# Patient Record
Sex: Female | Born: 1970 | Race: Black or African American | Hispanic: No | Marital: Single | Smoking: Current every day smoker
Health system: Southern US, Community
[De-identification: ages and names within clinical notes are randomized; demographics above are authoritative.]

## PROBLEM LIST (undated history)

## (undated) DIAGNOSIS — E119 Type 2 diabetes mellitus without complications: Secondary | ICD-10-CM

---

## 2004-08-22 ENCOUNTER — Emergency Department: Payer: Self-pay | Admitting: Emergency Medicine

## 2004-08-23 ENCOUNTER — Emergency Department: Payer: Self-pay | Admitting: Emergency Medicine

## 2004-12-05 ENCOUNTER — Emergency Department: Payer: Self-pay | Admitting: Emergency Medicine

## 2005-09-14 ENCOUNTER — Other Ambulatory Visit: Payer: Self-pay

## 2005-09-15 ENCOUNTER — Observation Stay: Payer: Self-pay | Admitting: Internal Medicine

## 2005-09-17 ENCOUNTER — Ambulatory Visit: Payer: Self-pay | Admitting: Internal Medicine

## 2005-11-02 ENCOUNTER — Ambulatory Visit: Payer: Self-pay | Admitting: Family Medicine

## 2005-11-15 ENCOUNTER — Other Ambulatory Visit: Payer: Self-pay

## 2005-11-15 ENCOUNTER — Emergency Department: Payer: Self-pay | Admitting: Emergency Medicine

## 2005-12-02 ENCOUNTER — Ambulatory Visit: Payer: Self-pay | Admitting: Family Medicine

## 2006-03-31 ENCOUNTER — Emergency Department: Payer: Self-pay | Admitting: Unknown Physician Specialty

## 2007-01-15 ENCOUNTER — Emergency Department: Payer: Self-pay | Admitting: Emergency Medicine

## 2007-03-30 ENCOUNTER — Emergency Department: Payer: Self-pay | Admitting: Emergency Medicine

## 2007-12-06 ENCOUNTER — Emergency Department: Payer: Self-pay | Admitting: Unknown Physician Specialty

## 2007-12-09 ENCOUNTER — Ambulatory Visit: Payer: Self-pay | Admitting: Family Medicine

## 2007-12-16 ENCOUNTER — Ambulatory Visit: Payer: Self-pay | Admitting: Family Medicine

## 2007-12-22 ENCOUNTER — Ambulatory Visit: Payer: Self-pay | Admitting: Family Medicine

## 2010-01-14 ENCOUNTER — Emergency Department: Payer: Self-pay | Admitting: Unknown Physician Specialty

## 2010-09-03 HISTORY — PX: BACK SURGERY: SHX140

## 2010-10-11 ENCOUNTER — Ambulatory Visit: Payer: Self-pay | Admitting: Family Medicine

## 2010-10-23 ENCOUNTER — Emergency Department: Payer: Self-pay | Admitting: Unknown Physician Specialty

## 2010-10-30 ENCOUNTER — Emergency Department: Payer: Self-pay | Admitting: Internal Medicine

## 2010-11-10 DIAGNOSIS — IMO0002 Reserved for concepts with insufficient information to code with codable children: Secondary | ICD-10-CM | POA: Insufficient documentation

## 2010-12-13 DIAGNOSIS — I824Z9 Acute embolism and thrombosis of unspecified deep veins of unspecified distal lower extremity: Secondary | ICD-10-CM | POA: Insufficient documentation

## 2011-01-26 DIAGNOSIS — M21969 Unspecified acquired deformity of unspecified lower leg: Secondary | ICD-10-CM | POA: Insufficient documentation

## 2011-02-11 ENCOUNTER — Emergency Department: Payer: Self-pay | Admitting: Emergency Medicine

## 2014-07-26 ENCOUNTER — Ambulatory Visit: Payer: Self-pay | Admitting: Physician Assistant

## 2015-08-07 ENCOUNTER — Encounter: Payer: Self-pay | Admitting: Emergency Medicine

## 2015-08-07 ENCOUNTER — Emergency Department: Payer: 59

## 2015-08-07 ENCOUNTER — Emergency Department
Admission: EM | Admit: 2015-08-07 | Discharge: 2015-08-07 | Disposition: A | Discharge status: Home / Self Care | Payer: 59 | Attending: Emergency Medicine | Admitting: Emergency Medicine

## 2015-08-07 DIAGNOSIS — S29012A Strain of muscle and tendon of back wall of thorax, initial encounter: Secondary | ICD-10-CM | POA: Diagnosis not present

## 2015-08-07 DIAGNOSIS — Y9289 Other specified places as the place of occurrence of the external cause: Secondary | ICD-10-CM | POA: Insufficient documentation

## 2015-08-07 DIAGNOSIS — X58XXXA Exposure to other specified factors, initial encounter: Secondary | ICD-10-CM | POA: Diagnosis not present

## 2015-08-07 DIAGNOSIS — Y998 Other external cause status: Secondary | ICD-10-CM | POA: Diagnosis not present

## 2015-08-07 DIAGNOSIS — S299XXA Unspecified injury of thorax, initial encounter: Secondary | ICD-10-CM | POA: Diagnosis present

## 2015-08-07 DIAGNOSIS — F17200 Nicotine dependence, unspecified, uncomplicated: Secondary | ICD-10-CM | POA: Insufficient documentation

## 2015-08-07 DIAGNOSIS — S29019A Strain of muscle and tendon of unspecified wall of thorax, initial encounter: Secondary | ICD-10-CM

## 2015-08-07 DIAGNOSIS — Y9389 Activity, other specified: Secondary | ICD-10-CM | POA: Insufficient documentation

## 2015-08-07 LAB — URINALYSIS COMPLETE WITH MICROSCOPIC (ARMC ONLY)
Glucose, UA: >500 mg/dL — AB
Hgb urine dipstick: NEGATIVE
LEUKOCYTES UA: NEGATIVE
NITRITE: NEGATIVE
PH: 5 (ref 5.0–8.0)
PROTEIN: 30 mg/dL — AB
SPECIFIC GRAVITY, URINE: 1.045 — AB (ref 1.005–1.030)

## 2015-08-07 MED ORDER — METHOCARBAMOL 500 MG PO TABS
1000.0000 mg | ORAL_TABLET | Freq: Once | ORAL | Status: AC
  Administered 2015-08-07: 1000 mg via ORAL

## 2015-08-07 MED ORDER — KETOROLAC TROMETHAMINE 10 MG PO TABS: 10.0000 mg | ORAL_TABLET | Freq: Four times a day (QID) | ORAL | Status: DC | PRN

## 2015-08-07 MED ORDER — METHOCARBAMOL 1000 MG/10ML IJ SOLN
1000.0000 mg | Freq: Once | INTRAMUSCULAR | Status: DC
  Filled 2015-08-07: qty 10

## 2015-08-07 MED ORDER — ORPHENADRINE CITRATE 30 MG/ML IJ SOLN
60.0000 mg | Freq: Two times a day (BID) | INTRAMUSCULAR | Status: DC
  Filled 2015-08-07: qty 2

## 2015-08-07 MED ORDER — KETOROLAC TROMETHAMINE 60 MG/2ML IM SOLN
60.0000 mg | Freq: Once | INTRAMUSCULAR | Status: AC
  Administered 2015-08-07: 60 mg via INTRAMUSCULAR
  Filled 2015-08-07: qty 2

## 2015-08-07 MED ORDER — METHOCARBAMOL 500 MG PO TABS
ORAL_TABLET | ORAL | Status: AC
  Administered 2015-08-07: 1000 mg via ORAL
  Filled 2015-08-07: qty 2

## 2015-08-07 MED ORDER — HYDROMORPHONE HCL 1 MG/ML IJ SOLN
1.0000 mg | Freq: Once | INTRAMUSCULAR | Status: AC
  Administered 2015-08-07: 1 mg via INTRAMUSCULAR
  Filled 2015-08-07: qty 1

## 2015-08-07 MED ORDER — ONDANSETRON 4 MG PO TBDP
ORAL_TABLET | ORAL | Status: AC
  Filled 2015-08-07: qty 1

## 2015-08-07 MED ORDER — TRAMADOL HCL 50 MG PO TABS: 50.0000 mg | ORAL_TABLET | Freq: Four times a day (QID) | ORAL | Status: DC | PRN

## 2015-08-07 MED ORDER — METHOCARBAMOL 750 MG PO TABS: 1500.0000 mg | ORAL_TABLET | Freq: Four times a day (QID) | ORAL | Status: DC

## 2015-08-07 MED ORDER — ONDANSETRON 8 MG PO TBDP
8.0000 mg | ORAL_TABLET | Freq: Once | ORAL | Status: AC
  Administered 2015-08-07: 8 mg via ORAL
  Filled 2015-08-07: qty 1

## 2015-08-07 NOTE — Discharge Instructions (Signed)
Thoracic Strain °Thoracic strain is an injury to the muscles or tendons that attach to the upper back. A strain can be mild or severe. A mild strain may take only 1-2 weeks to heal. A severe strain involves torn muscles or tendons, so it may take 6-8 weeks to heal. °HOME CARE °· Rest as needed. Limit your activity as told by your doctor. °· If directed, put ice on the injured area: °¨ Put ice in a plastic bag. °¨ Place a towel between your skin and the bag. °¨ Leave the ice on for 20 minutes, 2-3 times per day. °· Take over-the-counter and prescription medicines only as told by your doctor. °· Begin doing exercises as told by your doctor or physical therapist. °· Warm up before being active. °· Bend your knees before you lift heavy objects. °· Keep all follow-up visits as told by your doctor. This is important. °GET HELP IF: °· Your pain is not helped by medicine. °· Your pain, bruising, or swelling is getting worse. °· You have a fever. °GET HELP RIGHT AWAY IF: °· You have shortness of breath. °· You have chest pain. °· You have weakness or loss of feeling (numbness) in your legs. °· You cannot control when you pee (urinate). °  °This information is not intended to replace advice given to you by your health care provider. Make sure you discuss any questions you have with your health care provider. °  °Document Released: 02/06/2008 Document Revised: 05/11/2015 Document Reviewed: 10/14/2014 °Elsevier Interactive Patient Education ©2016 Elsevier Inc. ° °

## 2015-08-07 NOTE — ED Notes (Signed)
Pt given zofran - states doesn't want to wait stating since she threw up she wouldn't throw up again. Pt medicated po meds

## 2015-08-07 NOTE — ED Notes (Addendum)
Pt presents to ER with family with c/o back pain that started this morning when she woke up Pt states pain is where her ribs are more so mid back pain. Pt states pain is sharp. Pt has hx of back surgery. Denies all other sources of pain. 10/10. Pt denies hx of kidney stones.

## 2015-08-07 NOTE — ED Provider Notes (Signed)
[facility] Emergency Department Provider Note  ____________________________________________  Time seen: Approximately 8:13 PM  I have reviewed the triage vital signs and the nursing notes.   HISTORY  Chief Complaint Back Pain    HPI Denise Olsen is a 44 y.o. female patient complaining of right upper back pain to the point a.m. awakening. Patient stated the pain is in the wrist and radiates to her mid back. Describes the pain as sharp. Patient denies any provocative incident for this complaint. Patient is rating the pain as 8/10. No palliative measures taken for this complaint. Patient denies any history of kidney stones.   History reviewed. No pertinent past medical history.  There are no active problems to display for this patient.   History reviewed. No pertinent past surgical history.  Current Outpatient Rx  Name  Route  Sig  Dispense  Refill  . ketorolac (TORADOL) 10 MG tablet   Oral   Take 1 tablet (10 mg total) by mouth every 6 (six) hours as needed.   20 tablet   0   . methocarbamol (ROBAXIN-750) 750 MG tablet   Oral   Take 2 tablets (1,500 mg total) by mouth 4 (four) times daily.   40 tablet   0   . traMADol (ULTRAM) 50 MG tablet   Oral   Take 1 tablet (50 mg total) by mouth every 6 (six) hours as needed for moderate pain.   12 tablet   0     Allergies Review of patient's allergies indicates no known allergies.  History reviewed. No pertinent family history.  Social History Social History  Substance Use Topics  . Smoking status: Current Every Day Smoker  . Smokeless tobacco: None  . Alcohol Use: No    Review of Systems Constitutional: No fever/chills Eyes: No visual changes. ENT: No sore throat. Cardiovascular: Denies chest pain. Respiratory: Denies shortness of breath. Gastrointestinal: No abdominal pain.  No nausea, no vomiting.  No diarrhea.  No constipation. Genitourinary: Negative for  dysuria. Musculoskeletal: Right thoracic pain  Skin: Negative for rash. Neurological: Negative for headaches, focal weakness or numbness. 10-point ROS otherwise negative.  ____________________________________________   PHYSICAL EXAM:  VITAL SIGNS: ED Triage Vitals  Enc Vitals Group     BP 08/07/15 1944 167/95 mmHg     Pulse Rate 08/07/15 1944 110     Resp 08/07/15 1944 18     Temp 08/07/15 1944 97.6 F (36.4 C)     Temp Source 08/07/15 1944 Oral     SpO2 08/07/15 1944 97 %     Weight 08/07/15 1944 185 lb (83.915 kg)     Height 08/07/15 1944 5\' 7"  (1.702 m)     Head Cir --      Peak Flow --      Pain Score 08/07/15 1945 10     Pain Loc --      Pain Edu? --      Excl. in GC? --     Constitutional: Alert and oriented. Well appearing and in no acute distress. Eyes: Conjunctivae are normal. PERRL. EOMI. Head: Atraumatic. Nose: No congestion/rhinnorhea. Mouth/Throat: Mucous membranes are moist.  Oropharynx non-erythematous. Neck: No stridor.  No cervical spine tenderness to palpation. Hematological/Lymphatic/Immunilogical: No cervical lymphadenopathy. Cardiovascular: Normal rate, regular rhythm. Grossly normal heart sounds.  Good peripheral circulation. Respiratory: Normal respiratory effort.  No retractions. Lungs CTAB. Gastrointestinal: Soft and nontender. No distention. No abdominal bruits. No CVA tenderness. Musculoskeletal: No lower extremity tenderness nor edema.  No  joint effusions. Neurologic:  Normal speech and language. No gross focal neurologic deficits are appreciated. No gait instability. Skin:  Skin is warm, dry and intact. No rash noted. Psychiatric: Mood and affect are normal. Speech and behavior are normal.  ____________________________________________   LABS (all labs ordered are listed, but only abnormal results are displayed)  Labs Reviewed  URINALYSIS COMPLETEWITH MICROSCOPIC ([facility] ONLY) - Abnormal; Notable for the following:    Color, Urine AMBER  (*)    APPearance CLOUDY (*)    Glucose, UA >500 (*)    Bilirubin Urine 1+ (*)    Ketones, ur TRACE (*)    Specific Gravity, Urine 1.045 (*)    Protein, ur 30 (*)    Bacteria, UA RARE (*)    Squamous Epithelial / LPF 6-30 (*)    All other components within normal limits   ____________________________________________  EKG   ____________________________________________  RADIOLOGY  No acute findings on x-ray. I, Joni Reining, personally viewed and evaluated these images (plain radiographs) as part of my medical decision making.   ____________________________________________   PROCEDURES  Procedure(s) performed: None  Critical Care performed: No  ____________________________________________   INITIAL IMPRESSION / ASSESSMENT AND PLAN / ED COURSE  Pertinent labs & imaging results that were available during my care of the patient were reviewed by me and considered in my medical decision making (see chart for details).  Thoracic strain. Discussed negative x-ray findings a urinalysis with palpation. Patient given discharge home instructions for this complaint. Patient given prescription for tramadol, Robaxin, and Toradol. Patient advised follow-up with her family doctor this condition persists. Patient given a work note for 2 days. ____________________________________________   FINAL CLINICAL IMPRESSION(S) / ED DIAGNOSES  Final diagnoses:  Thoracic myofascial strain, initial encounter      Joni Reining, PA-C 08/07/15 2213  Jeanmarie Plant, MD 08/07/15 2243

## 2015-08-07 NOTE — ED Notes (Addendum)
While trying to do the assessment on the patient and at the same time trying to draw up injections pt became angry and states she wasn't answering anymore questions. I continued drawing up meds and verifying label information and order information, the whole time the pt was yelling and crying. the pt became impatient and rolled over to her side and kept hitting the railing of the bed and screaming. When asked to stop, the husband stated i just needed to give the shots, and she stopped yelling and told me that she wasn't hurting anything and to just hurry up. Continued drawing up meds and gave the two injections. This all occurred within a 4 minute time frame.

## 2017-05-24 ENCOUNTER — Other Ambulatory Visit: Payer: Self-pay | Admitting: Family Medicine

## 2017-05-24 DIAGNOSIS — Z1239 Encounter for other screening for malignant neoplasm of breast: Secondary | ICD-10-CM

## 2017-07-15 DIAGNOSIS — E119 Type 2 diabetes mellitus without complications: Secondary | ICD-10-CM | POA: Insufficient documentation

## 2018-10-09 ENCOUNTER — Encounter: Payer: Self-pay | Admitting: Emergency Medicine

## 2018-10-09 ENCOUNTER — Emergency Department
Admission: EM | Admit: 2018-10-09 | Discharge: 2018-10-09 | Disposition: A | Discharge status: Home / Self Care | Payer: 59 | Attending: Emergency Medicine | Admitting: Emergency Medicine

## 2018-10-09 DIAGNOSIS — Z5321 Procedure and treatment not carried out due to patient leaving prior to being seen by health care provider: Secondary | ICD-10-CM | POA: Insufficient documentation

## 2018-10-09 DIAGNOSIS — R42 Dizziness and giddiness: Secondary | ICD-10-CM | POA: Diagnosis not present

## 2018-10-09 LAB — URINALYSIS, COMPLETE (UACMP) WITH MICROSCOPIC
BILIRUBIN URINE: NEGATIVE
Bacteria, UA: NONE SEEN
GLUCOSE, UA: NEGATIVE mg/dL
KETONES UR: NEGATIVE mg/dL
LEUKOCYTES UA: NEGATIVE
NITRITE: NEGATIVE
PH: 6 (ref 5.0–8.0)
Protein, ur: NEGATIVE mg/dL
SPECIFIC GRAVITY, URINE: 1.016 (ref 1.005–1.030)

## 2018-10-09 LAB — CBC
HCT: 38.2 % (ref 36.0–46.0)
Hemoglobin: 12.1 g/dL (ref 12.0–15.0)
MCH: 26 pg (ref 26.0–34.0)
MCHC: 31.7 g/dL (ref 30.0–36.0)
MCV: 82.2 fL (ref 80.0–100.0)
NRBC: 0 % (ref 0.0–0.2)
PLATELETS: 236 10*3/uL (ref 150–400)
RBC: 4.65 MIL/uL (ref 3.87–5.11)
RDW: 13.4 % (ref 11.5–15.5)
WBC: 12 10*3/uL — AB (ref 4.0–10.5)

## 2018-10-09 LAB — BASIC METABOLIC PANEL
ANION GAP: 9 (ref 5–15)
BUN: 12 mg/dL (ref 6–20)
CALCIUM: 9 mg/dL (ref 8.9–10.3)
CO2: 24 mmol/L (ref 22–32)
Chloride: 105 mmol/L (ref 98–111)
Creatinine, Ser: 0.74 mg/dL (ref 0.44–1.00)
GFR calc non Af Amer: >60 mL/min (ref >60)
Glucose, Bld: 188 mg/dL — ABNORMAL HIGH (ref 70–99)
Potassium: 3.5 mmol/L (ref 3.5–5.1)
SODIUM: 138 mmol/L (ref 135–145)

## 2018-10-09 LAB — GLUCOSE, CAPILLARY: Glucose-Capillary: 155 mg/dL — ABNORMAL HIGH (ref 70–99)

## 2018-10-09 MED ORDER — SODIUM CHLORIDE 0.9% FLUSH: 3.0000 mL | Freq: Once | INTRAVENOUS | Status: DC

## 2018-10-09 NOTE — ED Triage Notes (Signed)
Pt reports she has not been compliant with DM medication and today pt became dizzy at work and CBG was 595. Pt ambulatory to triage with steady gait, no distress noted.

## 2018-10-09 NOTE — ED Notes (Signed)
Pt st doesn't want to wait any longer; pt instr to return for any new or worsening symptoms

## 2018-10-13 ENCOUNTER — Telehealth: Payer: Self-pay | Admitting: Emergency Medicine

## 2018-10-13 NOTE — Telephone Encounter (Signed)
Called patient due to lwot to inquire about condition and follow up plans. She says she has her meds, but had not taken them for a few days.  Says she does not have the correct equipment for testing her sugar, as the pharmacy gave her mismatched supplies/euqipment.  She says she will call her doctor today and have them review the labs done here as well as let them know how she is doing now.

## 2019-07-02 DIAGNOSIS — M79641 Pain in right hand: Secondary | ICD-10-CM | POA: Insufficient documentation

## 2019-11-24 ENCOUNTER — Other Ambulatory Visit: Payer: Self-pay | Admitting: Sports Medicine

## 2019-11-24 DIAGNOSIS — G8929 Other chronic pain: Secondary | ICD-10-CM

## 2019-11-24 DIAGNOSIS — M5030 Other cervical disc degeneration, unspecified cervical region: Secondary | ICD-10-CM

## 2019-11-24 DIAGNOSIS — R200 Anesthesia of skin: Secondary | ICD-10-CM

## 2019-11-24 DIAGNOSIS — M62838 Other muscle spasm: Secondary | ICD-10-CM

## 2019-11-24 DIAGNOSIS — M542 Cervicalgia: Secondary | ICD-10-CM

## 2020-09-13 ENCOUNTER — Other Ambulatory Visit: Payer: Self-pay | Admitting: Physical Medicine & Rehabilitation

## 2020-09-13 DIAGNOSIS — M542 Cervicalgia: Secondary | ICD-10-CM

## 2020-09-13 DIAGNOSIS — G8929 Other chronic pain: Secondary | ICD-10-CM

## 2020-09-13 DIAGNOSIS — M5412 Radiculopathy, cervical region: Secondary | ICD-10-CM

## 2020-09-13 DIAGNOSIS — M5442 Lumbago with sciatica, left side: Secondary | ICD-10-CM

## 2020-09-23 ENCOUNTER — Ambulatory Visit
Admission: RE | Admit: 2020-09-23 | Discharge: 2020-09-23 | Disposition: A | Discharge status: Home / Self Care | Payer: BC Managed Care – PPO | Source: Ambulatory Visit | Attending: Physical Medicine & Rehabilitation | Admitting: Physical Medicine & Rehabilitation

## 2020-09-23 ENCOUNTER — Other Ambulatory Visit: Payer: Self-pay

## 2020-09-23 DIAGNOSIS — M5412 Radiculopathy, cervical region: Secondary | ICD-10-CM | POA: Diagnosis present

## 2020-09-23 DIAGNOSIS — M542 Cervicalgia: Secondary | ICD-10-CM

## 2020-09-23 DIAGNOSIS — M5441 Lumbago with sciatica, right side: Secondary | ICD-10-CM | POA: Diagnosis present

## 2020-09-23 DIAGNOSIS — G8929 Other chronic pain: Secondary | ICD-10-CM

## 2020-09-23 DIAGNOSIS — M5442 Lumbago with sciatica, left side: Secondary | ICD-10-CM | POA: Diagnosis present

## 2020-11-03 ENCOUNTER — Encounter: Payer: Self-pay | Admitting: Obstetrics and Gynecology

## 2020-11-03 ENCOUNTER — Ambulatory Visit (INDEPENDENT_AMBULATORY_CARE_PROVIDER_SITE_OTHER): Payer: BC Managed Care – PPO | Admitting: Obstetrics and Gynecology

## 2020-11-03 ENCOUNTER — Other Ambulatory Visit: Payer: Self-pay

## 2020-11-03 VITALS — BP 120/70 | Ht 67.0 in | Wt 167.4 lb

## 2020-11-03 DIAGNOSIS — N764 Abscess of vulva: Secondary | ICD-10-CM | POA: Diagnosis not present

## 2020-11-03 MED ORDER — FLUCONAZOLE 150 MG PO TABS: 150.0000 mg | ORAL_TABLET | ORAL | 0 refills | Status: AC

## 2020-11-03 MED ORDER — CLINDAMYCIN HCL 300 MG PO CAPS: 300.0000 mg | ORAL_CAPSULE | Freq: Two times a day (BID) | ORAL | 0 refills | Status: AC

## 2020-11-03 NOTE — Progress Notes (Signed)
Patient ID: Denise Olsen, female   DOB: 06-Aug-1971, 50 y.o.   MRN: [id number]  Reason for Consult: Gynecologic Exam   Referred by Raj Janus, MD  Subjective:     HPI:  Denise Olsen is a 50 y.o. female.  She reports that she noticed a boil of her left labia yesterday.  She reports that she is also having groin tenderness.  She reports that sometimes she is more prone to yeast infections after she discontinues her diabetes medication.  She has not previously had a boil or abscess here this would be drained.  History reviewed. No pertinent past medical history. History reviewed. No pertinent family history. History reviewed. No pertinent surgical history.  Short Social History:  Social History   Tobacco Use  . Smoking status: Current Every Day Smoker  . Smokeless tobacco: Never Used  Substance Use Topics  . Alcohol use: No    No Known Allergies  Current Outpatient Medications  Medication Sig Dispense Refill  . atorvastatin (LIPITOR) 40 MG tablet atorvastatin 40 mg tablet    . baclofen (LIORESAL) 10 MG tablet Take 10 mg by mouth 3 (three) times daily.    . Blood Glucose Monitoring Suppl (GLUCOCOM BLOOD GLUCOSE MONITOR) DEVI Accu-Chek Guide Me Glucose Meter  USE AS DIRECTED    . clindamycin (CLEOCIN) 300 MG capsule Take 1 capsule (300 mg total) by mouth 2 (two) times daily for 7 days. 14 capsule 0  . fluconazole (DIFLUCAN) 150 MG tablet Take 1 tablet (150 mg total) by mouth every 3 (three) days for 2 doses. 2 tablet 0  . ketorolac (TORADOL) 10 MG tablet Take 1 tablet (10 mg total) by mouth every 6 (six) hours as needed. 20 tablet 0  . metFORMIN (GLUCOPHAGE) 1000 MG tablet metformin 1,000 mg tablet    . methocarbamol (ROBAXIN-750) 750 MG tablet Take 2 tablets (1,500 mg total) by mouth 4 (four) times daily. 40 tablet 0  . traMADol (ULTRAM) 50 MG tablet Take 1 tablet (50 mg total) by mouth every 6 (six) hours as needed for moderate pain. 12 tablet 0   No current  facility-administered medications for this visit.    Review of Systems  Constitutional: Negative for chills, fatigue, fever and unexpected weight change.  HENT: Negative for trouble swallowing.  Eyes: Negative for loss of vision.  Respiratory: Negative for cough, shortness of breath and wheezing.  Cardiovascular: Negative for chest pain, leg swelling, palpitations and syncope.  GI: Negative for abdominal pain, blood in stool, diarrhea, nausea and vomiting.  GU: Negative for difficulty urinating, dysuria, frequency and hematuria.  Musculoskeletal: Negative for back pain, leg pain and joint pain.  Skin: Negative for rash.  Neurological: Negative for dizziness, headaches, light-headedness, numbness and seizures.  Psychiatric: Positive for depressed mood. Negative for behavioral problem, confusion and sleep disturbance.        Objective:  Objective   Vitals:   11/03/20 1432  BP: 120/70  Weight: 167 lb 6.4 oz (75.9 kg)  Height: 5\' 7"  (1.702 m)   Body mass index is 26.22 kg/m.  Physical Exam Vitals and nursing note reviewed. Exam conducted with a chaperone present.  Constitutional:      Appearance: Normal appearance. She is well-developed.  HENT:     Head: Normocephalic and atraumatic.  Eyes:     Extraocular Movements: Extraocular movements intact.     Pupils: Pupils are equal, round, and reactive to light.  Cardiovascular:     Rate and Rhythm: Normal rate and  regular rhythm.  Pulmonary:     Effort: Pulmonary effort is normal. No respiratory distress.     Breath sounds: Normal breath sounds.  Abdominal:     General: Abdomen is flat.     Palpations: Abdomen is soft.  Genitourinary:    Comments: External: Vulva abnormal. Abscess noted on left vulva 2cm x 1cm.   Musculoskeletal:        General: No signs of injury.  Skin:    General: Skin is warm and dry.  Neurological:     Mental Status: She is alert and oriented to person, place, and time.  Psychiatric:         Behavior: Behavior normal.        Thought Content: Thought content normal.        Judgment: Judgment normal.     Assessment/Plan:     50 year old with left vulvar abscess.  Abscess 2 cm x 1 cm in size of left labia.  Will trial oral antibiotics and advised sitz bath's over the weekend.  Patient will follow up early next week.  If abscess needs to be drained drainage can be performed at that time.  We will treat with Diflucan given patient's propensity for yeast infections after antibiotics and because of her history of diabetes.  Discussed Pap smear patient.  Patient not here today for annual but Pap smear is due.  Patient will have Pap smear when she follows up on Monday.  More than 20 minutes were spent face to face with the patient in the room, reviewing the medical record, labs and images, and coordinating care for the patient. The plan of management was discussed in detail and counseling was provided.      Adelene Idler MD [facility], [facility] 11/03/2020 2:56 PM

## 2020-11-03 NOTE — Patient Instructions (Addendum)
Skin Abscess  A skin abscess is an infected area on or under your skin that contains a collection of pus and other material. An abscess may also be called a furuncle, carbuncle, or boil. An abscess can occur in or on almost any part of your body. Some abscesses break open (rupture) on their own. Most continue to get worse unless they are treated. The infection can spread deeper into the body and eventually into your blood, which can make you feel ill. Treatment usually involves draining the abscess. What are the causes? An abscess occurs when germs, like bacteria, pass through your skin and cause an infection. This may be caused by:  A scrape or cut on your skin.  A puncture wound through your skin, including a needle injection or insect bite.  Blocked oil or sweat glands.  Blocked and infected hair follicles.  A cyst that forms beneath your skin (sebaceous cyst) and becomes infected. What increases the risk? This condition is more likely to develop in people who:  Have a weak body defense system (immune system).  Have diabetes.  Have dry and irritated skin.  Get frequent injections or use illegal IV drugs.  Have a foreign body in a wound, such as a splinter.  Have problems with their lymph system or veins. What are the signs or symptoms? Symptoms of this condition include:  A painful, firm bump under the skin.  A bump with pus at the top. This may break through the skin and drain. Other symptoms include:  Redness surrounding the abscess site.  Warmth.  Swelling of the lymph nodes (glands) near the abscess.  Tenderness.  A sore on the skin. How is this diagnosed? This condition may be diagnosed based on:  A physical exam.  Your medical history.  A sample of pus. This may be used to find out what is causing the infection.  Blood tests.  Imaging tests, such as an ultrasound, CT scan, or MRI. How is this treated? A small abscess that drains on its own may not  need treatment. Treatment for larger abscesses may include:  Moist heat or heat pack applied to the area several times a day.  A procedure to drain the abscess (incision and drainage).  Antibiotic medicines. For a severe abscess, you may first get antibiotics through an IV and then change to antibiotics by mouth. Follow these instructions at home: Medicines  Take over-the-counter and prescription medicines only as told by your health care provider.  If you were prescribed an antibiotic medicine, take it as told by your health care provider. Do not stop taking the antibiotic even if you start to feel better.   Abscess care  If you have an abscess that has not drained, apply heat to the affected area. Use the heat source that your health care provider recommends, such as a moist heat pack or a heating pad. ? Place a towel between your skin and the heat source. ? Leave the heat on for 20-30 minutes. ? Remove the heat if your skin turns bright red. This is especially important if you are unable to feel pain, heat, or cold. You may have a greater risk of getting burned.  Follow instructions from your health care provider about how to take care of your abscess. Make sure you: ? Cover the abscess with a bandage (dressing). ? Change your dressing or gauze as told by your health care provider. ? Wash your hands with soap and water before you change the   dressing or gauze. If soap and water are not available, use hand sanitizer.  Check your abscess every day for signs of a worsening infection. Check for: ? More redness, swelling, or pain. ? More fluid or blood. ? Warmth. ? More pus or a bad smell.   General instructions  To avoid spreading the infection: ? Do not share personal care items, towels, or hot tubs with others. ? Avoid making skin contact with other people.  Keep all follow-up visits as told by your health care provider. This is important. Contact a health care provider if you  have:  More redness, swelling, or pain around your abscess.  More fluid or blood coming from your abscess.  Warm skin around your abscess.  More pus or a bad smell coming from your abscess.  A fever.  Muscle aches.  Chills or a general ill feeling. Get help right away if you:  Have severe pain.  See red streaks on your skin spreading away from the abscess. Summary  A skin abscess is an infected area on or under your skin that contains a collection of pus and other material.  A small abscess that drains on its own may not need treatment.  Treatment for larger abscesses may include having a procedure to drain the abscess and taking an antibiotic. This information is not intended to replace advice given to you by your health care provider. Make sure you discuss any questions you have with your health care provider. Document Revised: 12/11/2018 Document Reviewed: 10/03/2017 Elsevier Patient Education  2021 Elsevier Inc. How to Take a [employer] A sitz bath is a warm water bath that may be used to care for your rectum, genital area, or the area between your rectum and genitals (perineum). In a sitz bath, the water only comes up to your hips and covers your buttocks. A sitz bath may be done in a bathtub or with a portable sitz bath that fits over the toilet. Your health care provider may recommend a sitz bath to help:  Relieve pain and discomfort after delivering a baby.  Relieve pain and itching from hemorrhoids or anal fissures.  Relieve pain after certain surgeries.  Relax muscles that are sore or tight. How to take a sitz bath Take 3-4 sitz baths a day, or as many as told by your health care provider. Bathtub sitz bath To take a sitz bath in a bathtub: 1. Partially fill a bathtub with warm water. The water should be deep enough to cover your hips and buttocks when you are sitting in the tub. 2. Follow your health care provider's instructions if you are told to put medicine  in the water. 3. Sit in the water. Open the tub drain a little, and leave it open during your bath. 4. Turn on the warm water again, enough to replace the water that is draining out. Keep the water running throughout your bath. This helps keep the water at the right level and temperature. 5. Soak in the water for 15-20 minutes, or as long as told by your health care provider. 6. When you are done, be careful when you stand up. You may feel dizzy. 7. After the sitz bath, pat yourself dry. Do not rub your skin to dry it.   Over-the-toilet sitz bath To take a sitz bath with an over-the-toilet basin: 1. Follow the manufacturer's instructions. 2. Fill the basin with warm water. 3. Follow your health care provider's instructions if you were told to put  medicine in the water. 4. Sit on the seat. Make sure the water covers your buttocks and perineum. 5. Soak in the water for 15-20 minutes, or as long as told by your health care provider. 6. After the sitz bath, pat yourself dry. Do not rub your skin to dry it. 7. Clean and dry the basin between uses. 8. Discard the basin if it cracks, or according to the manufacturer's instructions.   Contact a health care provider if:  Your pain or itching gets worse. Do not continue with sitz baths if your symptoms get worse.  You have new symptoms. Do not continue with sitz baths until you talk with your health care provider. Summary  A sitz bath is a warm water bath in which the water only comes up to your hips and covers your buttocks.  A sitz bath may help relieve pain and discomfort after delivering a baby. It also may help with pain and itching from hemorrhoids or anal fissures, or pain after certain surgeries. It can also help to relax muscles that are sore or tight.  Take 3-4 sitz baths a day, or as many as told by your health care provider. Soak in the water for 15-20 minutes.  Do not continue with sitz baths if your symptoms get worse. This  information is not intended to replace advice given to you by your health care provider. Make sure you discuss any questions you have with your health care provider. Document Revised: 05/05/2020 Document Reviewed: 05/05/2020 Elsevier Patient Education  2021 [employer].

## 2021-05-31 ENCOUNTER — Encounter: Payer: Self-pay | Admitting: Emergency Medicine

## 2021-05-31 ENCOUNTER — Other Ambulatory Visit: Payer: Self-pay

## 2021-05-31 ENCOUNTER — Ambulatory Visit
Admission: EM | Admit: 2021-05-31 | Discharge: 2021-05-31 | Disposition: A | Discharge status: Home / Self Care | Payer: BC Managed Care – PPO | Attending: Internal Medicine | Admitting: Internal Medicine

## 2021-05-31 DIAGNOSIS — M542 Cervicalgia: Secondary | ICD-10-CM | POA: Insufficient documentation

## 2021-05-31 DIAGNOSIS — M549 Dorsalgia, unspecified: Secondary | ICD-10-CM | POA: Diagnosis present

## 2021-05-31 DIAGNOSIS — F17210 Nicotine dependence, cigarettes, uncomplicated: Secondary | ICD-10-CM | POA: Insufficient documentation

## 2021-05-31 DIAGNOSIS — F32A Depression, unspecified: Secondary | ICD-10-CM | POA: Insufficient documentation

## 2021-05-31 DIAGNOSIS — M546 Pain in thoracic spine: Secondary | ICD-10-CM | POA: Insufficient documentation

## 2021-05-31 DIAGNOSIS — R634 Abnormal weight loss: Secondary | ICD-10-CM | POA: Insufficient documentation

## 2021-05-31 DIAGNOSIS — M25519 Pain in unspecified shoulder: Secondary | ICD-10-CM | POA: Insufficient documentation

## 2021-05-31 DIAGNOSIS — E119 Type 2 diabetes mellitus without complications: Secondary | ICD-10-CM | POA: Insufficient documentation

## 2021-05-31 LAB — COMPREHENSIVE METABOLIC PANEL
ALT: 19 U/L (ref 0–44)
AST: 25 U/L (ref 15–41)
Albumin: 4.6 g/dL (ref 3.5–5.0)
Alkaline Phosphatase: 101 U/L (ref 38–126)
Anion gap: 9 (ref 5–15)
BUN: 9 mg/dL (ref 6–20)
CO2: 23 mmol/L (ref 22–32)
Calcium: 9.4 mg/dL (ref 8.9–10.3)
Chloride: 103 mmol/L (ref 98–111)
Creatinine, Ser: 0.63 mg/dL (ref 0.44–1.00)
GFR, Estimated: >60 mL/min (ref >60)
Glucose, Bld: 111 mg/dL — ABNORMAL HIGH (ref 70–99)
Potassium: 3.9 mmol/L (ref 3.5–5.1)
Sodium: 135 mmol/L (ref 135–145)
Total Bilirubin: 0.6 mg/dL (ref 0.3–1.2)
Total Protein: 8.4 g/dL — ABNORMAL HIGH (ref 6.5–8.1)

## 2021-05-31 LAB — CBC WITH DIFFERENTIAL/PLATELET
Abs Immature Granulocytes: 0.04 10*3/uL (ref 0.00–0.07)
Basophils Absolute: 0.1 10*3/uL (ref 0.0–0.1)
Basophils Relative: 1 %
Eosinophils Absolute: 0.8 10*3/uL — ABNORMAL HIGH (ref 0.0–0.5)
Eosinophils Relative: 6 %
HCT: 39.8 % (ref 36.0–46.0)
Hemoglobin: 12.9 g/dL (ref 12.0–15.0)
Immature Granulocytes: 0 %
Lymphocytes Relative: 40 %
Lymphs Abs: 4.9 10*3/uL — ABNORMAL HIGH (ref 0.7–4.0)
MCH: 26.5 pg (ref 26.0–34.0)
MCHC: 32.4 g/dL (ref 30.0–36.0)
MCV: 81.9 fL (ref 80.0–100.0)
Monocytes Absolute: 0.9 10*3/uL (ref 0.1–1.0)
Monocytes Relative: 7 %
Neutro Abs: 5.5 10*3/uL (ref 1.7–7.7)
Neutrophils Relative %: 46 %
Platelets: 329 10*3/uL (ref 150–400)
RBC: 4.86 MIL/uL (ref 3.87–5.11)
RDW: 13.6 % (ref 11.5–15.5)
WBC: 12.2 10*3/uL — ABNORMAL HIGH (ref 4.0–10.5)
nRBC: 0 % (ref 0.0–0.2)

## 2021-05-31 LAB — VITAMIN D 25 HYDROXY (VIT D DEFICIENCY, FRACTURES): Vit D, 25-Hydroxy: 19.63 ng/mL — ABNORMAL LOW (ref 30–100)

## 2021-05-31 LAB — GLUCOSE, CAPILLARY: Glucose-Capillary: 119 mg/dL — ABNORMAL HIGH (ref 70–99)

## 2021-05-31 LAB — HEMOGLOBIN A1C
Hgb A1c MFr Bld: 8.2 % — ABNORMAL HIGH (ref 4.8–5.6)
Mean Plasma Glucose: 188.64 mg/dL

## 2021-05-31 LAB — TSH: TSH: 1.682 u[IU]/mL (ref 0.350–4.500)

## 2021-05-31 MED ORDER — CYCLOBENZAPRINE HCL 7.5 MG PO TABS: 7.5000 mg | ORAL_TABLET | Freq: Every evening | ORAL | 0 refills | Status: DC | PRN

## 2021-05-31 MED ORDER — KETOROLAC TROMETHAMINE 60 MG/2ML IM SOLN
30.0000 mg | Freq: Once | INTRAMUSCULAR | Status: AC
  Administered 2021-05-31: 30 mg via INTRAMUSCULAR

## 2021-05-31 MED ORDER — IBUPROFEN 600 MG PO TABS: 600.0000 mg | ORAL_TABLET | Freq: Four times a day (QID) | ORAL | 0 refills | Status: DC | PRN

## 2021-05-31 NOTE — ED Triage Notes (Signed)
Pt presents today with c/o of upper back/neck pain x 2 days. She reports doing heavy lifting at work which may be contributing to her pain. No specific injury.

## 2021-05-31 NOTE — Discharge Instructions (Addendum)
Please take medications as prescribed Gentle range of motion exercises Heating pad use on a 30-minute on-30 minutes off cycle will help with the back pain Take muscle relaxant only at bedtime.  Do not operate any machinery or drive after taking muscle relaxant because it makes you drowsy Follow-up with primary care physician to be restarted on your diabetes medications Return to urgent care if symptoms worsen.

## 2021-05-31 NOTE — ED Provider Notes (Addendum)
[facility]    CSN: [id number] Arrival date & time: 05/31/21  1527      History   Chief Complaint Chief Complaint  Patient presents with   Neck Pain   Shoulder Pain   Back Pain    HPI Denise Olsen is a 50 y.o. female with history of diabetes mellitus type 2 comes to urgent care with 2-day history of mid back pain as well as neck pain.  Pain is severe, sharp, aggravated by movement with no known relieving factors.  Patient denies any fall or trauma to the back.  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  No numbness or tingling in the fingers.  No weakness in the hands.  No bruising over the back.  Patient also complains of weight loss.  She has lost appetite over the past several months.  She feels stressed.  She has depressed mood over the past several months.  She denies any suicidal or homicidal thoughts. HPI  History reviewed. No pertinent past medical history.  There are no problems to display for this patient.   Past Surgical History:  Procedure Laterality Date   BACK SURGERY  2012    OB History   No obstetric history on file.      Home Medications    Prior to Admission medications   Medication Sig Start Date End Date Taking? Authorizing Provider  cyclobenzaprine (FEXMID) 7.5 MG tablet Take 1 tablet (7.5 mg total) by mouth at bedtime as needed for muscle spasms. 05/31/21  Yes Vonda Harth, Britta Mccreedy, MD  ibuprofen (ADVIL) 600 MG tablet Take 1 tablet (600 mg total) by mouth every 6 (six) hours as needed. 05/31/21  Yes Zaiyah Sottile, Britta Mccreedy, MD  Blood Glucose Monitoring Suppl (GLUCOCOM BLOOD GLUCOSE MONITOR) DEVI Accu-Chek Guide Me Glucose Meter  USE AS DIRECTED    [provider]    Family History History reviewed. No pertinent family history.  Social History Social History   Tobacco Use   Smoking status: Every Day   Smokeless tobacco: Never  Vaping Use   Vaping Use: Never used  Substance Use Topics    Alcohol use: No   Drug use: No     Allergies   Patient has no known allergies.   Review of Systems Review of Systems  Constitutional: Negative.   Respiratory: Negative.  Negative for cough, chest tightness and shortness of breath.   Gastrointestinal: Negative.   Musculoskeletal:  Negative for arthralgias and joint swelling.  Skin: Negative.   Neurological: Negative.   Psychiatric/Behavioral: Negative.      Physical Exam Triage Vital Signs ED Triage Vitals  Enc Vitals Group     BP 05/31/21 1551 115/67     Pulse Rate 05/31/21 1551 86     Resp 05/31/21 1551 18     Temp 05/31/21 1551 98.3 F (36.8 C)     Temp Source 05/31/21 1551 Oral     SpO2 05/31/21 1551 100 %     Weight --      Height --      Head Circumference --      Peak Flow --      Pain Score 05/31/21 1547 7     Pain Loc --      Pain Edu? --      Excl. in GC? --    No data found.  Updated Vital Signs BP 115/67 (BP Location: Left Arm)   Pulse 86   Temp 98.3 F (36.8 C) (  Oral)   Resp 18   LMP  (Exact Date)   SpO2 100%   Visual Acuity Right Eye Distance:   Left Eye Distance:   Bilateral Distance:    Right Eye Near:   Left Eye Near:    Bilateral Near:     Physical Exam Vitals and nursing note reviewed.  Constitutional:      General: She is not in acute distress.    Appearance: She is not ill-appearing.  Cardiovascular:     Rate and Rhythm: Normal rate and regular rhythm.     Pulses: Normal pulses.     Heart sounds: Normal heart sounds.  Pulmonary:     Effort: Pulmonary effort is normal.     Breath sounds: Normal breath sounds.  Musculoskeletal:        General: Tenderness present. No swelling. Normal range of motion.     Comments: Tenderness over the right paraspinal muscles in the upper back.  Neurological:     Mental Status: She is alert.     UC Treatments / Results  Labs (all labs ordered are listed, but only abnormal results are displayed) Labs Reviewed  CBC WITH  DIFFERENTIAL/PLATELET - Abnormal; Notable for the following components:      Result Value   WBC 12.2 (*)    Lymphs Abs 4.9 (*)    Eosinophils Absolute 0.8 (*)    All other components within normal limits  COMPREHENSIVE METABOLIC PANEL - Abnormal; Notable for the following components:   Glucose, Bld 111 (*)    Total Protein 8.4 (*)    All other components within normal limits  GLUCOSE, CAPILLARY - Abnormal; Notable for the following components:   Glucose-Capillary 119 (*)    All other components within normal limits  VITAMIN D 25 HYDROXY (VIT D DEFICIENCY, FRACTURES)  TSH  HEMOGLOBIN A1C  CBG MONITORING, ED    EKG   Radiology No results found.  Procedures Procedures (including critical care time)  Medications Ordered in UC Medications  ketorolac (TORADOL) injection 30 mg (30 mg Intramuscular Given 05/31/21 1640)    Initial Impression / Assessment and Plan / UC Course  I have reviewed the triage vital signs and the nursing notes.  Pertinent labs & imaging results that were available during my care of the patient were reviewed by me and considered in my medical decision making (see chart for details).     1.  Musculoskeletal back pain: Toradol injection 30 mg IM x1 dose Ibuprofen 600 mg every 6 hours as needed for pain Flexeril 7.5 mg at bedtime as needed-precautions given Gentle range of motion exercises Return to urgent care if symptoms worsen  2. Unintentional Weight loss: CBC, BMP, hemoglobin A1c,TSH and point-of-care glucose.  Final Clinical Impressions(s) / UC Diagnoses   Final diagnoses:  Mid back pain on right side     Discharge Instructions      Please take medications as prescribed Gentle range of motion exercises Heating pad use on a 30-minute on-30 minutes off cycle will help with the back pain Take muscle relaxant only at bedtime.  Do not operate any machinery or drive after taking muscle relaxant because it makes you drowsy Follow-up with  primary care physician to be restarted on your diabetes medications Return to urgent care if symptoms worsen.   ED Prescriptions     Medication Sig Dispense Auth. Provider   cyclobenzaprine (FEXMID) 7.5 MG tablet Take 1 tablet (7.5 mg total) by mouth at bedtime as needed for muscle spasms. 20  tablet Nyema Hachey, Britta Mccreedy, MD   ibuprofen (ADVIL) 600 MG tablet Take 1 tablet (600 mg total) by mouth every 6 (six) hours as needed. 30 tablet Mckenize Mezera, Britta Mccreedy, MD      PDMP not reviewed this encounter.   Merrilee Jansky, MD 05/31/21 1705    Merrilee Jansky, MD 07/08/21 2211

## 2021-07-24 ENCOUNTER — Other Ambulatory Visit
Admission: RE | Admit: 2021-07-24 | Discharge: 2021-07-24 | Disposition: A | Discharge status: Home / Self Care | Payer: BC Managed Care – PPO | Source: Ambulatory Visit | Attending: Student | Admitting: Student

## 2021-07-24 DIAGNOSIS — M542 Cervicalgia: Secondary | ICD-10-CM | POA: Diagnosis not present

## 2021-07-24 DIAGNOSIS — R0781 Pleurodynia: Secondary | ICD-10-CM | POA: Insufficient documentation

## 2021-07-24 LAB — D-DIMER, QUANTITATIVE: D-Dimer, Quant: 0.5 ug/mL-FEU (ref 0.00–0.50)

## 2021-07-26 DIAGNOSIS — Z720 Tobacco use: Secondary | ICD-10-CM | POA: Insufficient documentation

## 2021-07-26 DIAGNOSIS — Z3042 Encounter for surveillance of injectable contraceptive: Secondary | ICD-10-CM | POA: Insufficient documentation

## 2021-08-22 ENCOUNTER — Encounter: Payer: Self-pay | Admitting: Emergency Medicine

## 2021-08-22 ENCOUNTER — Ambulatory Visit
Admission: EM | Admit: 2021-08-22 | Discharge: 2021-08-22 | Disposition: A | Discharge status: Home / Self Care | Payer: BC Managed Care – PPO | Attending: Family Medicine | Admitting: Family Medicine

## 2021-08-22 ENCOUNTER — Other Ambulatory Visit: Payer: Self-pay

## 2021-08-22 DIAGNOSIS — J101 Influenza due to other identified influenza virus with other respiratory manifestations: Secondary | ICD-10-CM | POA: Diagnosis not present

## 2021-08-22 LAB — POCT INFLUENZA A/B
Influenza A, POC: POSITIVE — AB
Influenza B, POC: NEGATIVE

## 2021-08-22 MED ORDER — BENZONATATE 100 MG PO CAPS: 200.0000 mg | ORAL_CAPSULE | Freq: Three times a day (TID) | ORAL | 0 refills | Status: DC | PRN

## 2021-08-22 MED ORDER — ALBUTEROL SULFATE HFA 108 (90 BASE) MCG/ACT IN AERS: 1.0000 | INHALATION_SPRAY | Freq: Four times a day (QID) | RESPIRATORY_TRACT | 0 refills | Status: AC | PRN

## 2021-08-22 MED ORDER — OSELTAMIVIR PHOSPHATE 75 MG PO CAPS: 75.0000 mg | ORAL_CAPSULE | Freq: Two times a day (BID) | ORAL | 0 refills | Status: DC

## 2021-08-22 NOTE — Discharge Instructions (Addendum)
Influenza test is positive Continue to alternate Tylenol and ibuprofen for management of fever. Force fluids to maintain hydration. Tamiflu once daily for the next 5 days to reduce symptoms and course of influenza virus.  If you develop any shortness of breath, wheezing or difficulty breathing go immediately to the nearest emergency department.

## 2021-08-22 NOTE — ED Provider Notes (Signed)
Denise Olsen    CSN: [id number] Arrival date & time: 08/22/21  1143      History   Chief Complaint Chief Complaint  Patient presents with   Fever   Generalized Body Aches   Cough    HPI Denise Olsen is a 50 y.o. female.   HPI Patient presents today with a 3-day history of fever, generalized body aches, cough and headache.  Unknown of any sick contacts.  Patient is febrile on arrival 100.3.  She endorses chest heaviness and tightness.  She is a daily smoker.  Reports a history of bronchitis requiring albuterol.  Denies any audible wheezing.  She has not taken any medication for symptoms History reviewed. No pertinent past medical history.  There are no problems to display for this patient.   Past Surgical History:  Procedure Laterality Date   BACK SURGERY  2012    OB History   No obstetric history on file.      Home Medications    Prior to Admission medications   Medication Sig Start Date End Date Taking? Authorizing Provider  albuterol (VENTOLIN HFA) 108 (90 Base) MCG/ACT inhaler Inhale 1-2 puffs into the lungs every 6 (six) hours as needed for wheezing or shortness of breath. 08/22/21  Yes Bing Neighbors, FNP  benzonatate (TESSALON) 100 MG capsule Take 2 capsules (200 mg total) by mouth 3 (three) times daily as needed for cough. 08/22/21  Yes Bing Neighbors, FNP  oseltamivir (TAMIFLU) 75 MG capsule Take 1 capsule (75 mg total) by mouth 2 (two) times daily. 08/22/21  Yes Bing Neighbors, FNP  Blood Glucose Monitoring Suppl (GLUCOCOM BLOOD GLUCOSE MONITOR) DEVI Accu-Chek Guide Me Glucose Meter  USE AS DIRECTED    [provider]  cyclobenzaprine (FEXMID) 7.5 MG tablet Take 1 tablet (7.5 mg total) by mouth at bedtime as needed for muscle spasms. 05/31/21   Merrilee Jansky, MD  ibuprofen (ADVIL) 600 MG tablet Take 1 tablet (600 mg total) by mouth every 6 (six) hours as needed. 05/31/21   Lamptey, Britta Mccreedy, MD    Family  History History reviewed. No pertinent family history.  Social History Social History   Tobacco Use   Smoking status: Every Day   Smokeless tobacco: Never  Vaping Use   Vaping Use: Never used  Substance Use Topics   Alcohol use: No   Drug use: No     Allergies   Patient has no known allergies.  Review of Systems Review of Systems Pertinent negatives listed in HPI   Physical Exam Triage Vital Signs ED Triage Vitals [08/22/21 1322]  Enc Vitals Group     BP 139/82     Pulse Rate (!) 107     Resp 18     Temp 100.3 F (37.9 C)     Temp Source Oral     SpO2 95 %     Weight      Height      Head Circumference      Peak Flow      Pain Score 0     Pain Loc      Pain Edu?      Excl. in GC?    No data found.  Updated Vital Signs BP 139/82 (BP Location: Left Arm)    Pulse (!) 107    Temp 100.3 F (37.9 C) (Oral)    Resp 18    SpO2 95%   Visual Acuity Right Eye Distance:   Left  Eye Distance:   Bilateral Distance:    Right Eye Near:   Left Eye Near:    Bilateral Near:     Physical Exam  General Appearance:    Alert, cooperative, no distress  HENT:   Normocephalic, ears normal, nares mucosal edema with congestion, rhinorrhea, oropharynx clear   Eyes:    PERRL, conjunctiva/corneas clear, EOM's intact       Lungs:     Clear to auscultation bilaterally, respirations unlabored  Heart:    Regular rate and rhythm  Neurologic:   Awake, alert, oriented x 3. No apparent focal neurological           defect.      UC Treatments / Results  Labs (all labs ordered are listed, but only abnormal results are displayed) Labs Reviewed  POCT INFLUENZA A/B - Abnormal; Notable for the following components:      Result Value   Influenza A, POC Positive (*)    All other components within normal limits    EKG   Radiology No results found.  Procedures Procedures (including critical care time)  Medications Ordered in UC Medications - No data to display  Initial  Impression / Assessment and Plan / UC Course  I have reviewed the triage vital signs and the nursing notes.  Pertinent labs & imaging results that were available during my care of the patient were reviewed by me and considered in my medical decision making (see chart for details).    Influenza A confirmed by rapid flu test. Continue to alternate Tylenol and ibuprofen for management of fever. Force fluids to maintain hydration. Tamiflu once daily for the next 5 days to reduce symptoms and course of influenza virus.  If you develop any shortness of breath, wheezing or difficulty breathing go immediately to the nearest emergency department.  Final Clinical Impressions(s) / UC Diagnoses   Final diagnoses:  Influenza A     Discharge Instructions      Influenza test is positive Continue to alternate Tylenol and ibuprofen for management of fever. Force fluids to maintain hydration. Tamiflu once daily for the next 5 days to reduce symptoms and course of influenza virus.  If you develop any shortness of breath, wheezing or difficulty breathing go immediately to the nearest emergency department.      ED Prescriptions     Medication Sig Dispense Auth. Provider   benzonatate (TESSALON) 100 MG capsule Take 2 capsules (200 mg total) by mouth 3 (three) times daily as needed for cough. 40 capsule Bing Neighbors, FNP   oseltamivir (TAMIFLU) 75 MG capsule Take 1 capsule (75 mg total) by mouth 2 (two) times daily. 10 capsule Bing Neighbors, FNP   albuterol (VENTOLIN HFA) 108 (90 Base) MCG/ACT inhaler Inhale 1-2 puffs into the lungs every 6 (six) hours as needed for wheezing or shortness of breath. 8 g Bing Neighbors, FNP      PDMP not reviewed this encounter.   Bing Neighbors, FNP 08/22/21 1407

## 2021-08-22 NOTE — ED Triage Notes (Signed)
Pt c/o cough, fever, bodyaches, HA x 3 days

## 2021-09-14 ENCOUNTER — Other Ambulatory Visit: Payer: Self-pay | Admitting: Family Medicine

## 2021-09-26 DIAGNOSIS — F32A Depression, unspecified: Secondary | ICD-10-CM | POA: Insufficient documentation

## 2021-09-26 DIAGNOSIS — E785 Hyperlipidemia, unspecified: Secondary | ICD-10-CM | POA: Insufficient documentation

## 2022-01-21 ENCOUNTER — Ambulatory Visit: Payer: BC Managed Care – PPO

## 2022-01-21 ENCOUNTER — Ambulatory Visit (INDEPENDENT_AMBULATORY_CARE_PROVIDER_SITE_OTHER): Payer: BC Managed Care – PPO

## 2022-01-21 ENCOUNTER — Ambulatory Visit
Admission: EM | Admit: 2022-01-21 | Discharge: 2022-01-21 | Disposition: A | Discharge status: Home / Self Care | Payer: BC Managed Care – PPO | Attending: Emergency Medicine | Admitting: Emergency Medicine

## 2022-01-21 DIAGNOSIS — M79674 Pain in right toe(s): Secondary | ICD-10-CM | POA: Diagnosis not present

## 2022-01-21 DIAGNOSIS — M7989 Other specified soft tissue disorders: Secondary | ICD-10-CM

## 2022-01-21 MED ORDER — IBUPROFEN 600 MG PO TABS: 600.0000 mg | ORAL_TABLET | Freq: Four times a day (QID) | ORAL | 0 refills | Status: DC | PRN

## 2022-01-21 NOTE — ED Provider Notes (Signed)
Denise Olsen    CSN: [id number] Arrival date & time: 01/21/22  1141      History   Chief Complaint Chief Complaint  Patient presents with   Foot Pain    HPI Denise Olsen is a 51 y.o. female.  Patient presents with pain and swelling of her right foot since yesterday.  No falls or injury.  She attributes her symptoms to wearing wedge heels.  She states she has had intermittent foot pain for 30 years since she broke her foot; she states she gets these symptoms when she wears heels or standing for a long time.  Treatment at home with Tylenol.  She denies numbness, weakness, redness, bruising, lesions, rash, or other symptoms.  The history is provided by the patient and medical records.   History reviewed. No pertinent past medical history.  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      Home Medications    Prior to Admission medications   Medication Sig Start Date End Date Taking? Authorizing Provider  ibuprofen (ADVIL) 600 MG tablet Take 1 tablet (600 mg total) by mouth every 6 (six) hours as needed. 01/21/22  Yes Sharion Balloon, NP  albuterol (VENTOLIN HFA) 108 (90 Base) MCG/ACT inhaler Inhale 1-2 puffs into the lungs every 6 (six) hours as needed for wheezing or shortness of breath. 08/22/21   Scot Jun, FNP  benzonatate (TESSALON) 100 MG capsule Take 2 capsules (200 mg total) by mouth 3 (three) times daily as needed for cough. 08/22/21   Scot Jun, FNP   Blood Glucose Monitoring Suppl (GLUCOCOM BLOOD GLUCOSE MONITOR) DEVI Accu-Chek Guide Me Glucose Meter  USE AS DIRECTED    [provider]  cyclobenzaprine (FEXMID) 7.5 MG tablet Take 1 tablet (7.5 mg total) by mouth at bedtime as needed for muscle spasms. 05/31/21   LampteyMyrene Galas, MD  oseltamivir (TAMIFLU) 75 MG capsule Take 1 capsule (75 mg total) by mouth 2 (two) times daily. 08/22/21   Scot Jun, FNP    Family History History reviewed. No pertinent family history.  Social History Social History   Tobacco Use   Smoking status: Every Day   Smokeless tobacco: Never  Vaping Use   Vaping Use: Never used  Substance Use Topics   Alcohol use: No   Drug use: No     Allergies   Patient has no known allergies.   Review of Systems Review of Systems  Musculoskeletal:  Positive for arthralgias, gait problem and joint swelling.  Skin:  Negative for color change, rash and wound.  Neurological:  Negative for weakness and numbness.  All other systems reviewed and are negative.   Physical Exam Triage Vital Signs ED Triage Vitals  Enc Vitals Group     BP      Pulse      Resp      Temp      Temp src      SpO2  Weight      Height      Head Circumference      Peak Flow      Pain Score      Pain Loc      Pain Edu?      Excl. in [place]?    No data found.  Updated Vital Signs BP 113/80   Pulse 84   Temp 98.1 F (36.7 C)   Resp 18   SpO2 99%   Visual Acuity Right Eye Distance:   Left Eye Distance:   Bilateral Distance:    Right Eye Near:   Left Eye Near:    Bilateral Near:     Physical Exam Vitals and nursing note reviewed.  Constitutional:      General: She is not in acute distress.    Appearance: Normal appearance. She is well-developed. She is not ill-appearing.  HENT:     Mouth/Throat:     Mouth: Mucous membranes are moist.  Cardiovascular:     Rate and Rhythm: Normal rate and regular rhythm.  Pulmonary:     Effort: Pulmonary  effort is normal. No respiratory distress.  Musculoskeletal:        General: Swelling and tenderness present. Normal range of motion.     Cervical back: Neck supple.       Feet:  Skin:    General: Skin is warm and dry.     Capillary Refill: Capillary refill takes less than 2 seconds.     Findings: No bruising, erythema, lesion or rash.  Neurological:     General: No focal deficit present.     Mental Status: She is alert and oriented to person, place, and time.     Sensory: No sensory deficit.     Motor: No weakness.     Gait: Gait abnormal.     Comments: In wheelchair.  Psychiatric:        Mood and Affect: Mood normal.        Behavior: Behavior normal.     UC Treatments / Results  Labs (all labs ordered are listed, but only abnormal results are displayed) Labs Reviewed - No data to display  EKG   Radiology DG Foot Complete Right  Result Date: 01/21/2022 CLINICAL DATA:  Right foot pain and swelling beginning yesterday. EXAM: RIGHT FOOT COMPLETE - 3+ VIEW COMPARISON:  None Available. FINDINGS: There is no evidence of fracture or dislocation. Severe osteoarthritis is seen involving the talonavicular and navicular-cuneiform joints. No other focal bone lesions identified. IMPRESSION: No acute findings. Severe talonavicular and navicular-cuneiform osteoarthritis. Electronically Signed   By: Marlaine Hind M.D.   On: 01/21/2022 12:17    Procedures Procedures (including critical care time)  Medications Ordered in UC Medications - No data to display  Initial Impression / Assessment and Plan / UC Course  I have reviewed the triage vital signs and the nursing notes.  Pertinent labs & imaging results that were available during my care of the patient were reviewed by me and considered in my medical decision making (see chart for details).  Pain and swelling of the right foot.  X-ray shows osteoarthritis but no acute bony abnormality.  Treating with ibuprofen, rest, elevation, ice  packs, walking boot.  Instructed patient to follow-up with an orthopedist.  Contact information for [facility] provided as they are on-call today.  Patient agrees to plan of care.   Final Clinical Impressions(s) / UC Diagnoses   Final diagnoses:  Pain and swelling of toe of right  foot     Discharge Instructions      Take the ibuprofen as prescribed.  Rest and elevate your foot.  Apply ice packs 2-3 times a day for up to 20 minutes each.  Wear the walking boot as directed.  Follow up with an orthopedist.        ED Prescriptions     Medication Sig Dispense Auth. Provider   ibuprofen (ADVIL) 600 MG tablet Take 1 tablet (600 mg total) by mouth every 6 (six) hours as needed. 30 tablet Sharion Balloon, NP      PDMP not reviewed this encounter.   Sharion Balloon, NP 01/21/22 1242

## 2022-01-21 NOTE — ED Triage Notes (Signed)
Patient presents to Urgent Care with complaints of right foot pain and swelling since yesterday.Pt states she has a hx of a fracture about 30 years ago. Pt states when she wears heels or standing long time she gets these flare-ups. Treating pain with tylenol.

## 2022-01-21 NOTE — Discharge Instructions (Addendum)
Take the ibuprofen as prescribed.  Rest and elevate your foot.  Apply ice packs 2-3 times a day for up to 20 minutes each.  Wear the walking boot as directed.  Follow up with an orthopedist.

## 2022-03-15 ENCOUNTER — Ambulatory Visit
Admission: EM | Admit: 2022-03-15 | Discharge: 2022-03-15 | Disposition: A | Discharge status: Home / Self Care | Payer: BC Managed Care – PPO | Attending: Family Medicine | Admitting: Family Medicine

## 2022-03-15 ENCOUNTER — Encounter: Payer: Self-pay | Admitting: Emergency Medicine

## 2022-03-15 DIAGNOSIS — R079 Chest pain, unspecified: Secondary | ICD-10-CM | POA: Diagnosis present

## 2022-03-15 DIAGNOSIS — M25512 Pain in left shoulder: Secondary | ICD-10-CM | POA: Diagnosis present

## 2022-03-15 DIAGNOSIS — M5412 Radiculopathy, cervical region: Secondary | ICD-10-CM | POA: Diagnosis present

## 2022-03-15 DIAGNOSIS — M25511 Pain in right shoulder: Secondary | ICD-10-CM | POA: Insufficient documentation

## 2022-03-15 HISTORY — DX: Type 2 diabetes mellitus without complications: E11.9

## 2022-03-15 MED ORDER — INDOMETHACIN 25 MG PO CAPS: 25.0000 mg | ORAL_CAPSULE | Freq: Two times a day (BID) | ORAL | 0 refills | Status: AC

## 2022-03-15 MED ORDER — CYCLOBENZAPRINE HCL 5 MG PO TABS: 10.0000 mg | ORAL_TABLET | Freq: Two times a day (BID) | ORAL | 0 refills | Status: DC | PRN

## 2022-03-15 NOTE — ED Triage Notes (Signed)
Pt c/o left side neck and shoulder pain that radiates down into her chest x 3 days. Pt states she has quit tobacco 3 months ago and started vaping for the first time less than 1 month now. She has tried taking ibuprofen for pain relief.

## 2022-03-15 NOTE — ED Provider Notes (Signed)
Denise Olsen    CSN: [id number] Arrival date & time: 03/15/22  1610      History   Chief Complaint Chief Complaint  Patient presents with   Neck Pain   Shoulder Pain   Chest Pain    HPI Denise Olsen is a 51 y.o. female.   HPI Patient presents today for evaluation of left-sided neck and shoulder pain that radiates down into her chest which has been ongoing for 3 days.  Patient is currently not consistently taking chronic medications.  She is a current smoker and reports recently vaping. She had a recent A1c completed back in April which was not at goal and was 8.1.  She has a history of radiculitis involving the lumbosacral and thoracic region.  She denies any shortness of breath or chest pressure.  The pain waxes and wane. 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      Home Medications    Prior to Admission medications   Medication Sig Start Date End Date Taking? Authorizing Provider  albuterol (VENTOLIN HFA) 108 (90 Base) MCG/ACT inhaler Inhale 1-2 puffs into the lungs every 6 (six) hours as needed for wheezing or shortness of breath. 08/22/21  Yes Bing Neighbors, FNP  cyclobenzaprine (FLEXERIL) 5 MG tablet Take 2 tablets (10 mg total) by mouth 2 (two) times daily as needed (neck and shoulder pain). 03/15/22  Yes Bing Neighbors, FNP  Dapagliflozin-metFORMIN HCl ER  (XIGDUO XR) 01-999 MG TB24 Take by mouth. 05/20/20  Yes [provider]  escitalopram (LEXAPRO) 10 MG tablet Take by mouth. 10/22/21 10/22/22 Yes [provider]  gabapentin (NEURONTIN) 300 MG capsule Take by mouth. 11/27/19  Yes [provider]  glipiZIDE (GLUCOTROL XL) 10 MG 24 hr tablet Take 1 tablet by mouth daily. 03/07/16  Yes [provider]  ibuprofen (ADVIL) 600 MG tablet Take 1 tablet (600 mg total) by mouth every 6 (six) hours as needed. 01/21/22  Yes Mickie Bail, NP  indomethacin (INDOCIN) 25 MG capsule Take 1 capsule (25 mg total) by mouth 2 (two) times daily with a meal for 7 days. 03/15/22 03/22/22 Yes Bing Neighbors, FNP  atorvastatin (LIPITOR) 40 MG tablet     [provider]  benzonatate (TESSALON) 100 MG capsule Take 2 capsules (200 mg total) by mouth 3 (three) times daily as needed for cough. 08/22/21   Bing Neighbors, FNP  Blood Glucose Monitoring Suppl (GLUCOCOM BLOOD GLUCOSE MONITOR) DEVI Accu-Chek Guide Me Glucose Meter  USE AS DIRECTED    [provider]  oseltamivir (TAMIFLU) 75 MG capsule Take 1 capsule (75 mg total) by mouth 2 (two) times daily. 08/22/21   Bing Neighbors, FNP    Family History History reviewed. No pertinent family history.  Social History Social History   Tobacco Use   Smoking status: Every Day  Smokeless tobacco: Never  Vaping Use   Vaping Use: Some days  Substance Use Topics   Alcohol use: No   Drug use: No     Allergies   Patient has no known allergies.   Review of Systems Review of Systems Pertinent negatives listed in HPI   Physical Exam Triage Vital Signs ED Triage Vitals  Enc Vitals Group     BP 03/15/22 1634 100/69     Pulse Rate 03/15/22 1634 89     Resp 03/15/22 1634 18     Temp 03/15/22 1634 98.8 F (37.1 C)     Temp Source 03/15/22 1634 Oral     SpO2 03/15/22 1634 97 %     Weight --      Height --      Head Circumference --      Peak Flow --       Pain Score 03/15/22 1631 7     Pain Loc --      Pain Edu? --      Excl. in [place]? --    No data found.  Updated Vital Signs BP 100/69 (BP Location: Left Arm)   Pulse 89   Temp 98.8 F (37.1 C) (Oral)   Resp 18   SpO2 97%   Visual Acuity Right Eye Distance:   Left Eye Distance:   Bilateral Distance:    Right Eye Near:   Left Eye Near:    Bilateral Near:     Physical Exam Vitals reviewed.  Constitutional:      Appearance: She is well-developed.  HENT:     Head: Normocephalic.  Eyes:     Pupils: Pupils are equal, round, and reactive to light.  Cardiovascular:     Rate and Rhythm: Normal rate and regular rhythm.     Heart sounds: Normal heart sounds.     No systolic murmur is present.     No diastolic murmur is present.  Musculoskeletal:     Right shoulder: Swelling and bony tenderness present. Normal range of motion. Normal strength.     Left shoulder: Bony tenderness present. No swelling. Normal range of motion. Normal strength.     Cervical back: Pain with movement, spinous process tenderness and muscular tenderness present.  Skin:    General: Skin is warm.     Capillary Refill: Capillary refill takes less than 2 seconds.  Neurological:     General: No focal deficit present.     Mental Status: She is alert.      UC Treatments / Results  Labs (all labs ordered are listed, but only abnormal results are displayed) Labs Reviewed - No data to display  EKG   Radiology No results found.  Procedures Procedures (including critical care time)  Medications Ordered in UC Medications - No data to display  Initial Impression / Assessment and Plan / UC Course  I have reviewed the triage vital signs and the nursing notes.  Pertinent labs & imaging results that were available during my care of the patient were reviewed by me and considered in my medical decision making (see chart for details).    Initially worked up for chest pain however after further discussion  and clarification of patient she is having neck pain which is radiating into her shoulders and her clavicle and the left side is greater than right however it is bilaterally.  In review of the EMR patient has had significant work-up for back and cervical spine pain with an MRI completed  in 2022 which showed multiple chronic changes. Treating as cervical radiculopathy EKG normal sinus rhythm no ST wave changes. Treatment per discharge medication orders.  Advised patient to follow-up with [facility] if her pain worsen or does not improve.  Patient advised to follow-up with her primary care doctor to have her chronic medications filled. Final Clinical Impressions(s) / UC Diagnoses   Final diagnoses:  Cervical radiculopathy  Acute pain of both shoulders     Discharge Instructions      Follow-up with your primary care doctor for refills of your daily meds. I have given you information to follow-up with [facility] if your neck and shoulder and back pain becomes recurrent. Take medications as prescribed.  Recommend applications of heat to your neck and shoulders to help with pain.  Purchase a good firm pillow to give you support during sleep.     ED Prescriptions     Medication Sig Dispense Auth. Provider   indomethacin (INDOCIN) 25 MG capsule Take 1 capsule (25 mg total) by mouth 2 (two) times daily with a meal for 7 days. 14 capsule Bing Neighbors, FNP   cyclobenzaprine (FLEXERIL) 5 MG tablet Take 2 tablets (10 mg total) by mouth 2 (two) times daily as needed (neck and shoulder pain). 20 tablet Bing Neighbors, FNP      PDMP not reviewed this encounter.   Bing Neighbors, FNP 03/15/22 1718

## 2022-03-15 NOTE — Discharge Instructions (Addendum)
Follow-up with your primary care doctor for refills of your daily meds. I have given you information to follow-up with [facility] if your neck and shoulder and back pain becomes recurrent. Take medications as prescribed.  Recommend applications of heat to your neck and shoulders to help with pain.  Purchase a good firm pillow to give you support during sleep.

## 2022-05-26 ENCOUNTER — Ambulatory Visit
Admission: RE | Admit: 2022-05-26 | Discharge: 2022-05-26 | Disposition: A | Discharge status: Home / Self Care | Payer: BC Managed Care – PPO | Source: Ambulatory Visit | Attending: Emergency Medicine | Admitting: Emergency Medicine

## 2022-05-26 VITALS — BP 123/76 | HR 79 | Temp 98.4°F | Resp 18 | Ht 67.0 in

## 2022-05-26 DIAGNOSIS — K029 Dental caries, unspecified: Secondary | ICD-10-CM

## 2022-05-26 MED ORDER — AMOXICILLIN 875 MG PO TABS: 875.0000 mg | ORAL_TABLET | Freq: Two times a day (BID) | ORAL | 0 refills | Status: AC

## 2022-05-26 MED ORDER — IBUPROFEN 600 MG PO TABS: 600.0000 mg | ORAL_TABLET | Freq: Four times a day (QID) | ORAL | 0 refills | Status: DC | PRN

## 2022-05-26 NOTE — ED Triage Notes (Signed)
Patient to Urgent Care with complaints of dental pain, reports multiple teeth are sore and uncomfortable. Symptoms ongoing for approx 1 month.   Patient has been seen by her dentist and was told she would need three teeth worked on. Reports that her teeth and gum pain is worse after seeing the dentist. Patient has been using orajel mouth wash/ motrin/ tylenol (unable to use orajel ointment because it gives her abscesses). Repeat appointment with dentist appointment not until the end of October.   Patient diabetic.

## 2022-05-26 NOTE — ED Provider Notes (Signed)
Roderic Palau    CSN: [id number] Arrival date & time: 05/26/22  1143      History   Chief Complaint Chief Complaint  Patient presents with   Dental Problem    Entered by patient    HPI Denise Olsen is a 51 y.o. female.  Patient presents with dental pain x 1 month.  The dental pain is in multiple teeth on both sides, upper and lower.  She was seen by a dentist and referred to an oral surgeon; she has an appointment on October 31.  She denies fever, chills, difficulty swallowing, difficulty breathing, or other symptoms.  Treatment at home with Tylenol, ibuprofen, Orajel.  Her medical history includes diabetes, DVT, tobacco use.  The history is provided by the patient and medical records.    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      Home Medications    Prior to Admission medications   Medication Sig Start Date End Date Taking? Authorizing Provider  amoxicillin (AMOXIL) 875 MG tablet Take 1 tablet (875 mg total) by mouth 2 (two) times daily for 7 days. 05/26/22 06/02/22 Yes Sharion Balloon, NP  ibuprofen (ADVIL) 600 MG tablet Take 1 tablet (600 mg total) by mouth every 6 (six) hours as needed. 05/26/22  Yes Sharion Balloon, NP  albuterol (VENTOLIN HFA) 108 (90 Base) MCG/ACT inhaler Inhale 1-2 puffs into the lungs every 6 (six) hours as needed for wheezing or shortness of breath.  08/22/21   Scot Jun, FNP  atorvastatin (LIPITOR) 40 MG tablet     [provider]  benzonatate (TESSALON) 100 MG capsule Take 2 capsules (200 mg total) by mouth 3 (three) times daily as needed for cough. 08/22/21   Scot Jun, FNP  Blood Glucose Monitoring Suppl (GLUCOCOM BLOOD GLUCOSE MONITOR) DEVI Accu-Chek Guide Me Glucose Meter  USE AS DIRECTED    [provider]  cyclobenzaprine (FLEXERIL) 5 MG tablet Take 2 tablets (10 mg total) by mouth 2 (two) times daily as needed (neck and shoulder pain). 03/15/22   Scot Jun, FNP  Dapagliflozin-metFORMIN HCl ER (XIGDUO XR) 01-999 MG TB24 Take by mouth. 05/20/20   [provider]  escitalopram (LEXAPRO) 10 MG tablet Take by mouth. 10/22/21 10/22/22  [provider]  gabapentin (NEURONTIN) 300 MG capsule Take by mouth. 11/27/19   [provider]  glipiZIDE (GLUCOTROL XL) 10 MG 24 hr tablet Take 1 tablet by mouth daily. 03/07/16   [provider]  oseltamivir (TAMIFLU) 75 MG capsule Take 1 capsule (75 mg total) by mouth 2 (two) times daily. 08/22/21   Scot Jun, FNP    Family History History reviewed. No pertinent family history.  Social History Social History   Tobacco Use   Smoking status: Every Day   Smokeless tobacco: Never  Vaping Use   Vaping Use: Some  days  Substance Use Topics   Alcohol use: No   Drug use: No     Allergies   Patient has no known allergies.   Review of Systems Review of Systems  Constitutional:  Negative for chills and fever.  HENT:  Positive for dental problem. Negative for sore throat, trouble swallowing and voice change.   Respiratory:  Negative for cough and shortness of breath.   All other systems reviewed and are negative.    Physical Exam Triage Vital Signs ED Triage Vitals  Enc Vitals Group     BP      Pulse      Resp      Temp      Temp src      SpO2      Weight      Height      Head Circumference       Peak Flow      Pain Score      Pain Loc      Pain Edu?      Excl. in GC?    No data found.  Updated Vital Signs BP 123/76   Pulse 79   Temp 98.4 F (36.9 C)   Resp 18   Ht 5\' 7"  (1.702 m)   SpO2 96%   BMI 26.22 kg/m   Visual Acuity Right Eye Distance:   Left Eye Distance:   Bilateral Distance:    Right Eye Near:   Left Eye Near:    Bilateral Near:     Physical Exam Vitals and nursing note reviewed.  Constitutional:      General: She is not in acute distress.    Appearance: She is well-developed. She is not ill-appearing.  HENT:     Mouth/Throat:     Mouth: Mucous membranes are moist.     Dentition: Dental caries present.     Pharynx: Oropharynx is clear.     Comments: Patient indicates the dental pain is on both sides, upper and lower. Cardiovascular:     Rate and Rhythm: Normal rate and regular rhythm.     Heart sounds: Normal heart sounds.  Pulmonary:     Effort: Pulmonary effort is normal. No respiratory distress.     Breath sounds: Normal breath sounds.  Musculoskeletal:     Cervical back: Neck supple.  Skin:    General: Skin is warm and dry.  Neurological:     Mental Status: She is alert.  Psychiatric:        Mood and Affect: Mood normal.        Behavior: Behavior normal.      UC Treatments / Results  Labs (all labs ordered are listed, but only abnormal results are displayed) Labs Reviewed - No data to display  EKG   Radiology No results found.  Procedures Procedures (including critical care time)  Medications Ordered in UC Medications - No data to display  Initial Impression / Assessment and Plan / UC Course  I have reviewed the triage vital signs and the nursing notes.  Pertinent labs & imaging results that were available during my care of the patient were reviewed by me and considered in my medical decision making (see chart for details).   Dental pain due to dental caries.  Treating with amoxicillin and ibuprofen.  Instructed  patient to follow-up with the oral surgeon as scheduled on October 31.  Dental resource guide provided.  Education provided on dental caries.  Patient agrees to plan of  care.  Final Clinical Impressions(s) / UC Diagnoses   Final diagnoses:  Pain due to dental caries     Discharge Instructions      Take the amoxicillin and ibuprofen as directed.  A dental resource guide is attached.  Please call to make an appointment with a dentist as soon as possible.    Go to the emergency department if you have acute worsening symptoms.         ED Prescriptions     Medication Sig Dispense Auth. Provider   amoxicillin (AMOXIL) 875 MG tablet Take 1 tablet (875 mg total) by mouth 2 (two) times daily for 7 days. 14 tablet Wendee Beavers H, NP   ibuprofen (ADVIL) 600 MG tablet Take 1 tablet (600 mg total) by mouth every 6 (six) hours as needed. 30 tablet Mickie Bail, NP      I have reviewed the PDMP during this encounter.   Mickie Bail, NP 05/26/22 1241

## 2022-05-26 NOTE — Discharge Instructions (Addendum)
Take the amoxicillin and ibuprofen as directed.  A dental resource guide is attached.  Please call to make an appointment with a dentist as soon as possible.    Go to the emergency department if you have acute worsening symptoms.

## 2022-06-11 ENCOUNTER — Ambulatory Visit: Payer: BC Managed Care – PPO

## 2022-06-12 ENCOUNTER — Ambulatory Visit: Payer: Self-pay

## 2022-06-12 ENCOUNTER — Encounter: Payer: Self-pay | Admitting: Emergency Medicine

## 2022-06-12 ENCOUNTER — Ambulatory Visit
Admission: EM | Admit: 2022-06-12 | Discharge: 2022-06-12 | Disposition: A | Discharge status: Home / Self Care | Payer: BC Managed Care – PPO | Attending: Physician Assistant | Admitting: Physician Assistant

## 2022-06-12 DIAGNOSIS — K047 Periapical abscess without sinus: Secondary | ICD-10-CM

## 2022-06-12 DIAGNOSIS — K0889 Other specified disorders of teeth and supporting structures: Secondary | ICD-10-CM | POA: Diagnosis not present

## 2022-06-12 MED ORDER — KETOROLAC TROMETHAMINE 60 MG/2ML IM SOLN
30.0000 mg | Freq: Once | INTRAMUSCULAR | Status: AC
Start: 2022-06-12 — End: 2022-06-12
  Administered 2022-06-12: 30 mg via INTRAMUSCULAR

## 2022-06-12 MED ORDER — AMOXICILLIN-POT CLAVULANATE 875-125 MG PO TABS: 1.0000 | ORAL_TABLET | Freq: Two times a day (BID) | ORAL | 0 refills | Status: AC

## 2022-06-12 MED ORDER — HYDROCODONE-ACETAMINOPHEN 7.5-325 MG PO TABS: 1.0000 | ORAL_TABLET | Freq: Four times a day (QID) | ORAL | 0 refills | Status: AC | PRN

## 2022-06-12 NOTE — ED Triage Notes (Signed)
Pt presents with left side dental pain that she states has been going on for a while. She has a dental appointment scheduled for the end of October.

## 2022-06-12 NOTE — ED Provider Notes (Signed)
[facility]    CSN: [id number] Arrival date & time: 06/12/22  [id number]      History   Chief Complaint Chief Complaint  Patient presents with   Dental Pain    HPI Denise Olsen is a 51 y.o. female with history of diabetes, hyperlipidemia, depression.  Patient presents today for continued dental pain.  She was seen a couple weeks ago at a different urgent care through [facility] and was prescribed antibiotics which she says helped but her symptoms of severe pain returned over the past several days.  She reports a broken tooth of the left upper side.  She does have an appointment scheduled with an oral surgeon for the very end of October but says she is in immense pain.  She has been taking high doses of ibuprofen and Tylenol which have not been relieving the pain.  She has not had anything for pain relief today.  She is complaining of some left-sided facial swelling.  Denies fever.  No other concerns.  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      Home Medications    Prior to Admission medications   Medication Sig Start Date End Date Taking? Authorizing Provider  amoxicillin-clavulanate (AUGMENTIN) 875-125 MG tablet Take 1 tablet by mouth every 12 (twelve) hours for 14 days. 06/12/22 06/26/22 Yes Danton Clap, PA-C  HYDROcodone-acetaminophen (NORCO) 7.5-325 MG tablet Take 1  tablet by mouth every 6 (six) hours as needed for up to 3 days for severe pain. 06/12/22 06/15/22 Yes Danton Clap, PA-C  albuterol (VENTOLIN HFA) 108 (90 Base) MCG/ACT inhaler Inhale 1-2 puffs into the lungs every 6 (six) hours as needed for wheezing or shortness of breath. 08/22/21   Scot Jun, FNP  atorvastatin (LIPITOR) 40 MG tablet     [provider]  Blood Glucose Monitoring Suppl (GLUCOCOM BLOOD GLUCOSE MONITOR) DEVI Accu-Chek Guide Me Glucose Meter  USE AS DIRECTED    [provider]  cyclobenzaprine (FLEXERIL) 5 MG tablet Take 2 tablets (10 mg total) by mouth 2 (two) times daily as needed (neck and shoulder pain). 03/15/22   Scot Jun, FNP  Dapagliflozin-metFORMIN HCl ER (XIGDUO XR) 01-999 MG TB24 Take by mouth. 05/20/20   [provider]  escitalopram (LEXAPRO) 10 MG tablet Take by mouth. 10/22/21 10/22/22  [provider]  gabapentin (NEURONTIN) 300 MG capsule Take by mouth. 11/27/19   [provider]  glipiZIDE (GLUCOTROL XL) 10 MG 24 hr tablet Take 1 tablet by mouth daily. 03/07/16   [provider]  ibuprofen (ADVIL) 600 MG tablet Take 1 tablet (600 mg total) by mouth every 6 (six) hours as needed. 05/26/22   Sharion Balloon, NP    Family History No family history on file.  Social History Social History   Tobacco Use   Smoking status: Every Day  Smokeless tobacco: Never  Vaping Use   Vaping Use: Some days  Substance Use Topics   Alcohol use: No   Drug use: No     Allergies   Patient has no known allergies.   Review of Systems Review of Systems  Constitutional:  Negative for fatigue and fever.  HENT:  Positive for dental problem and facial swelling.   Neurological:  Negative for headaches.  Hematological:  Negative for adenopathy.     Physical Exam Triage Vital Signs ED Triage Vitals  Enc Vitals Group     BP      Pulse      Resp      Temp      Temp src      SpO2      Weight       Height      Head Circumference      Peak Flow      Pain Score      Pain Loc      Pain Edu?      Excl. in [place]?    No data found.  Updated Vital Signs BP (!) 151/99 (BP Location: Right Arm)   Pulse 100   Temp 98.8 F (37.1 C) (Oral)   Resp 18   SpO2 96%       Physical Exam Vitals and nursing note reviewed.  Constitutional:      General: She is not in acute distress.    Appearance: Normal appearance. She is not ill-appearing or toxic-appearing.     Comments: Patient is tearful in exam room due to pain.  HENT:     Head: Normocephalic and atraumatic.     Nose: Nose normal.     Mouth/Throat:     Mouth: Mucous membranes are moist.     Pharynx: Oropharynx is clear.      Comments: Tooth pictured above is broken and has a hole in the center.  It is tender to palpation.  Mild left-sided swelling of cheek. Eyes:     General: No scleral icterus.       Right eye: No discharge.        Left eye: No discharge.     Conjunctiva/sclera: Conjunctivae normal.  Cardiovascular:     Rate and Rhythm: Normal rate and regular rhythm.  Pulmonary:     Effort: Pulmonary effort is normal. No respiratory distress.  Musculoskeletal:     Cervical back: Neck supple.  Skin:    General: Skin is dry.  Neurological:     General: No focal deficit present.     Mental Status: She is alert. Mental status is at baseline.     Motor: No weakness.     Gait: Gait normal.      UC Treatments / Results  Labs (all labs ordered are listed, but only abnormal results are displayed) Labs Reviewed - No data to display  EKG   Radiology No results found.  Procedures Procedures (including critical care time)  Medications Ordered in UC Medications  ketorolac (TORADOL) injection 30 mg (30 mg Intramuscular Given 06/12/22 0850)    Initial Impression / Assessment and Plan / UC Course  I have reviewed the triage vital signs and the nursing notes.  Pertinent labs & imaging results that were available  during my care of the patient were reviewed by me and considered in my medical decision making (see chart for details).   51 year old female presents for severe dental pain of the tooth of the left upper  side.  Has an appointment with oral surgeon to have the tooth pulled at the end of this month.  Has been prescribed antibiotics in the past which helped her pain.  Currently her pain is not relief with OTC medications.  On examination she clearly has a broken tooth with a hole in the center.  I suspect that it probably is infected.  Since she had some improvement in her pain with use of the antibiotics, I sent more to the pharmacy.  Sent Augmentin.  Sent for a longer course.  Also prescribed short supply of Norco as needed for severe pain after reviewing controlled substance database and find patient to be low risk for abuse.  ED precautions given.   Final Clinical Impressions(s) / UC Diagnoses   Final diagnoses:  Pain, dental  Dental infection     Discharge Instructions      -We have sent antibiotics to pharmacy as well as something for pain.  Continue ibuprofen as needed for pain and use Orajel. - Keep appointment with oral surgeon to have the tooth removed to follow.  Needed. - Go to ER if you develop fever or worsening pain or swelling.     ED Prescriptions     Medication Sig Dispense Auth. Provider   amoxicillin-clavulanate (AUGMENTIN) 875-125 MG tablet Take 1 tablet by mouth every 12 (twelve) hours for 14 days. 28 tablet Laurene Footman B, PA-C   HYDROcodone-acetaminophen (NORCO) 7.5-325 MG tablet Take 1 tablet by mouth every 6 (six) hours as needed for up to 3 days for severe pain. 12 tablet Danton Clap, PA-C      I have reviewed the PDMP during this encounter.   Laurene Footman B, PA-C 06/12/22 [phone]

## 2022-06-12 NOTE — Discharge Instructions (Signed)
-  We have sent antibiotics to pharmacy as well as something for pain.  Continue ibuprofen as needed for pain and use Orajel. - Keep appointment with oral surgeon to have the tooth removed to follow.  Needed. - Go to ER if you develop fever or worsening pain or swelling.

## 2022-07-30 ENCOUNTER — Other Ambulatory Visit: Payer: Self-pay

## 2022-07-30 ENCOUNTER — Encounter: Payer: Self-pay | Admitting: *Deleted

## 2022-07-30 ENCOUNTER — Emergency Department
Admission: EM | Admit: 2022-07-30 | Discharge: 2022-07-31 | Disposition: A | Discharge status: Home / Self Care | Payer: BC Managed Care – PPO | Attending: Emergency Medicine | Admitting: Emergency Medicine

## 2022-07-30 DIAGNOSIS — M25552 Pain in left hip: Secondary | ICD-10-CM | POA: Diagnosis present

## 2022-07-30 DIAGNOSIS — R0789 Other chest pain: Secondary | ICD-10-CM | POA: Insufficient documentation

## 2022-07-30 DIAGNOSIS — M25511 Pain in right shoulder: Secondary | ICD-10-CM | POA: Diagnosis not present

## 2022-07-30 DIAGNOSIS — Z720 Tobacco use: Secondary | ICD-10-CM | POA: Diagnosis not present

## 2022-07-30 DIAGNOSIS — E119 Type 2 diabetes mellitus without complications: Secondary | ICD-10-CM | POA: Insufficient documentation

## 2022-07-30 DIAGNOSIS — M25512 Pain in left shoulder: Secondary | ICD-10-CM | POA: Diagnosis not present

## 2022-07-30 DIAGNOSIS — M542 Cervicalgia: Secondary | ICD-10-CM | POA: Diagnosis not present

## 2022-07-30 DIAGNOSIS — Y92410 Unspecified street and highway as the place of occurrence of the external cause: Secondary | ICD-10-CM | POA: Insufficient documentation

## 2022-07-30 MED ORDER — OXYCODONE-ACETAMINOPHEN 5-325 MG PO TABS
1.0000 | ORAL_TABLET | Freq: Once | ORAL | Status: AC
  Administered 2022-07-31: 1 via ORAL
  Filled 2022-07-30: qty 1
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
## 2022-07-31 ENCOUNTER — Emergency Department: Payer: BC Managed Care – PPO

## 2022-07-31 NOTE — ED Notes (Signed)
Pt signed esignature  d/c inst to pt.   
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
## 2022-08-07 ENCOUNTER — Other Ambulatory Visit: Payer: Self-pay | Admitting: Chiropractor

## 2022-08-07 ENCOUNTER — Ambulatory Visit
Admission: RE | Admit: 2022-08-07 | Discharge: 2022-08-07 | Disposition: A | Discharge status: Home / Self Care | Payer: BC Managed Care – PPO | Source: Ambulatory Visit | Attending: Chiropractor | Admitting: Chiropractor

## 2022-08-07 DIAGNOSIS — S161XXA Strain of muscle, fascia and tendon at neck level, initial encounter: Secondary | ICD-10-CM

## 2022-08-07 DIAGNOSIS — S46011A Strain of muscle(s) and tendon(s) of the rotator cuff of right shoulder, initial encounter: Secondary | ICD-10-CM

## 2022-08-27 IMAGING — MR MR CERVICAL SPINE W/O CM
5 series · 38 of 48 positions shown · non-contrast
Comparison: 07/15/2016 report.

CLINICAL DATA: Neck and bilateral shoulder pain.

EXAM:
MRI CERVICAL SPINE WITHOUT CONTRAST
TECHNIQUE: Multiplanar, multisequence MR imaging of the cervical spine was
performed. No intravenous contrast was administered.

[Series 5: T2 · sagittal · 3.0mm · 0.62mm/px · 8 of 15 slices shown (1 of 2)]
[im 1/15]
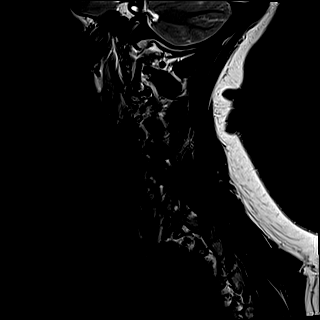
[im 3/15]
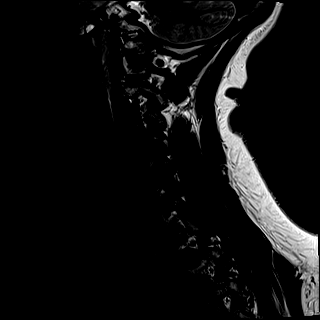
[im 5/15]
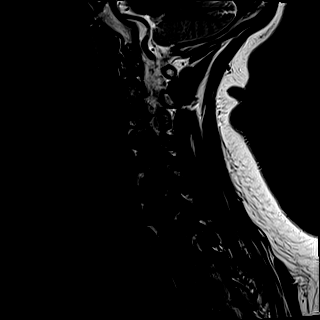
[im 7/15]
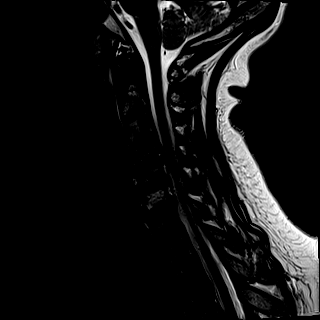
[im 9/15]
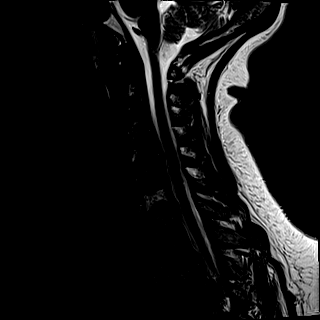
[im 11/15]
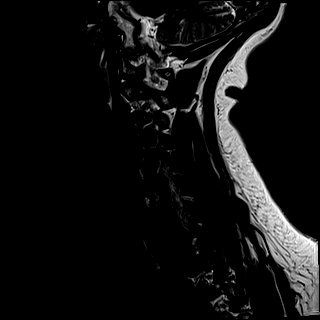
[im 13/15]
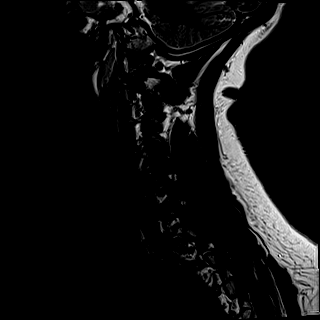
[im 15/15]
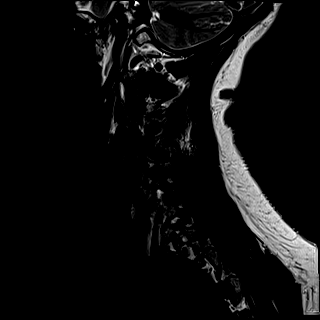

[Series 6: FLAIR · sagittal · 3.0mm · 0.78mm/px · 7 of 15 slices shown]
[im 1/15]
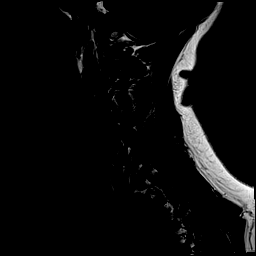
[im 3/15]
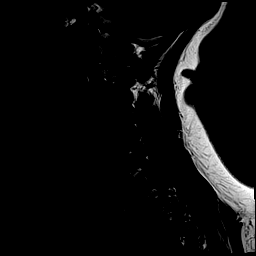
[im 5/15]
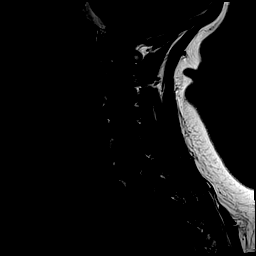
[im 8/15]
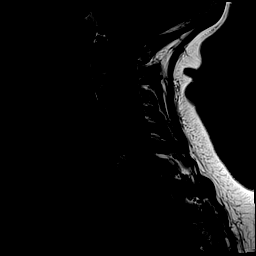
[im 10/15]
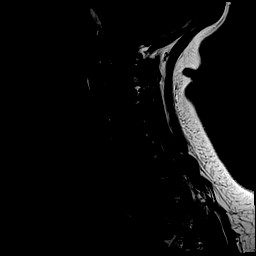
[im 12/15]
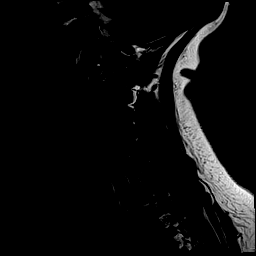
[im 15/15]
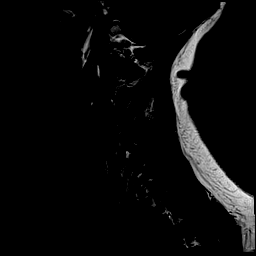

[Series 7: STIR · sagittal · 3.0mm · 0.62mm/px · 7 of 15 slices shown]
[im 1/15]
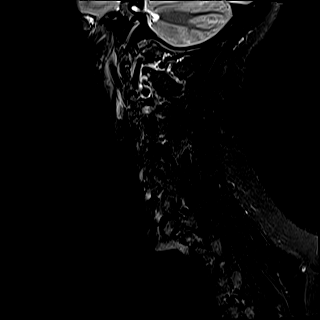
[im 3/15]
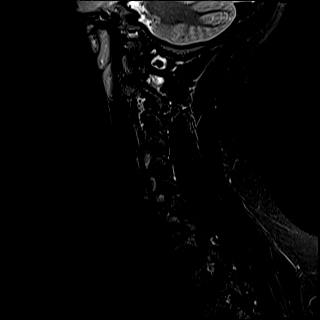
[im 5/15]
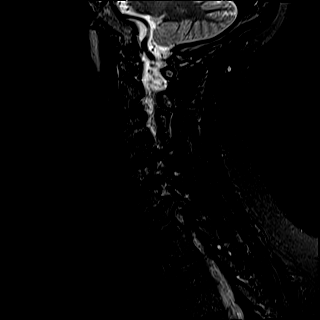
[im 8/15]
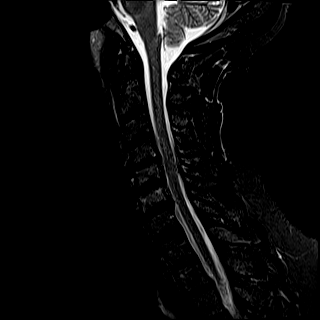
[im 10/15]
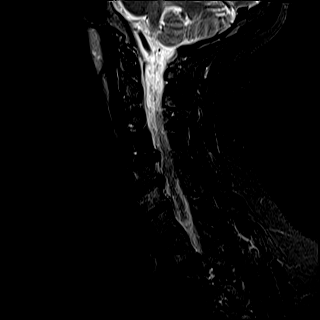
[im 12/15]
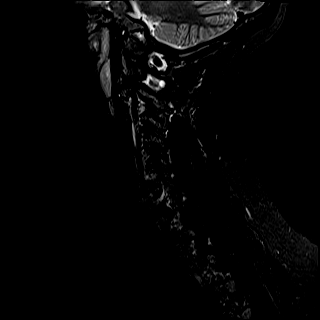
[im 15/15]
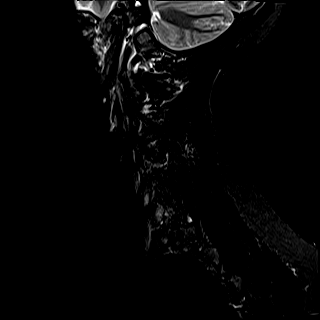

[Series 8: T2 · axial · 3.0mm · 0.70mm/px · z∈[-19,+72]mm · 9 of 28 slices shown (2 of 2)]
[im 1/28]
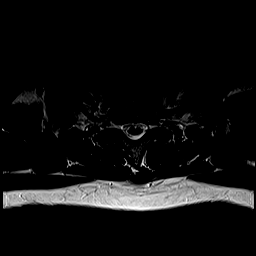
[im 5/28]
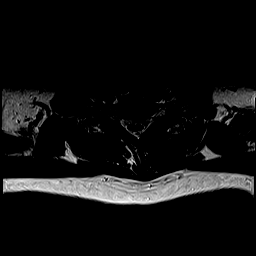
[im 10/28]
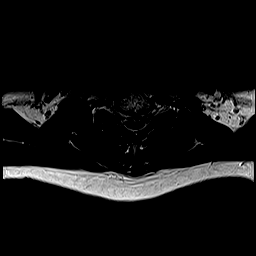
[im 12/28]
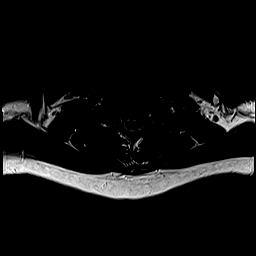
[im 14/28]
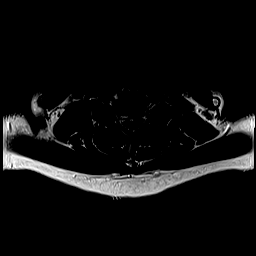
[im 16/28]
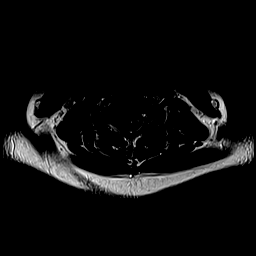
[im 19/28]
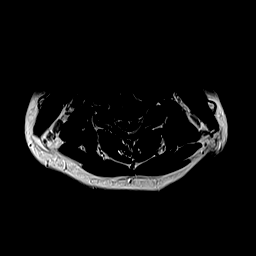
[im 23/28]
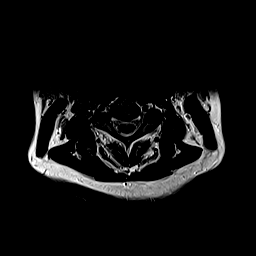
[im 28/28]
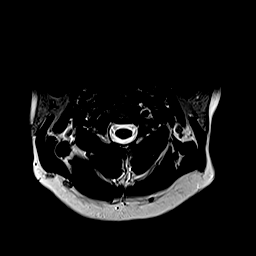

[Series 9: ax mpgr · axial · 3.0mm · 0.35mm/px · z∈[-19,+55]mm · 7 of 28 slices shown]
[im 1/28]
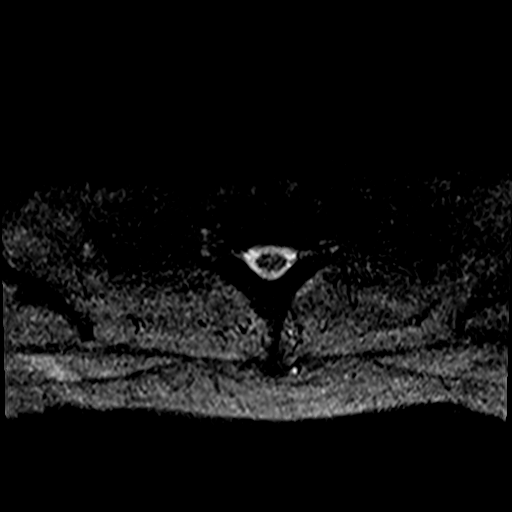
[im 5/28]
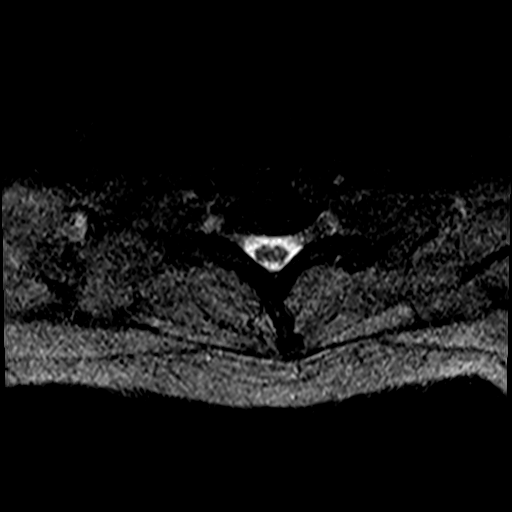
[im 10/28]
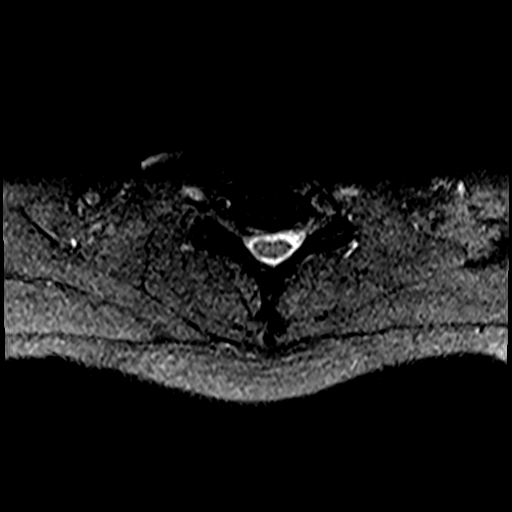
[im 12/28]
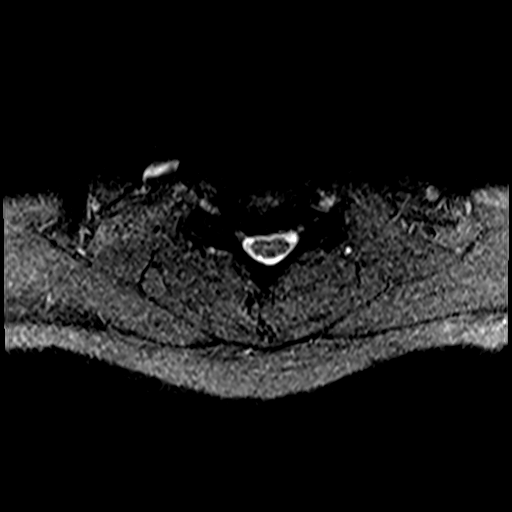
[im 16/28]
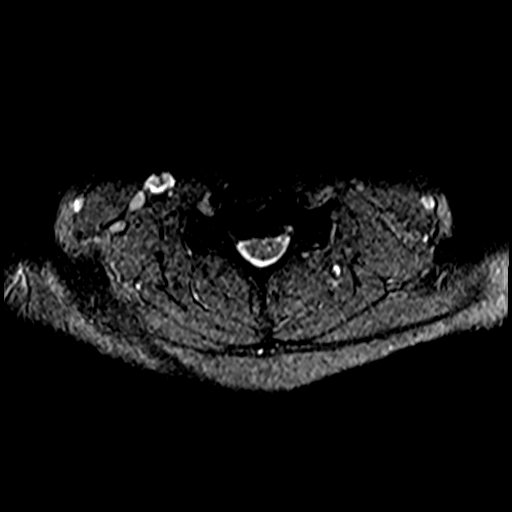
[im 19/28]
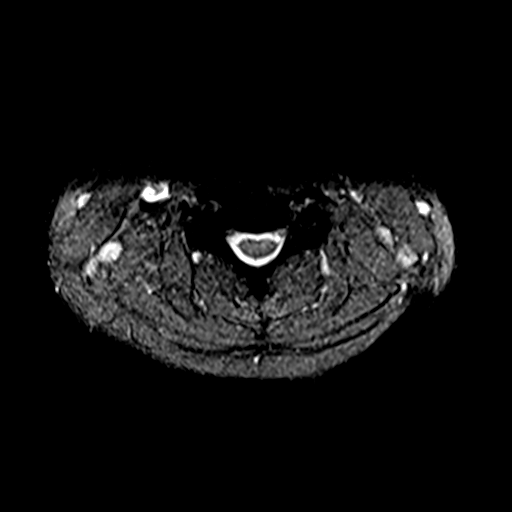
[im 23/28]
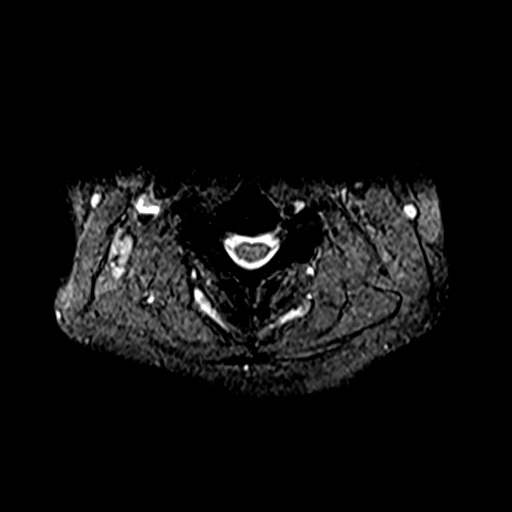

[38 of 48 positions shown; findings below may reference images not displayed]

FINDINGS: Alignment: Straightening of lordosis.

Vertebrae: Vertebral body heights are preserved. C6 hemangioma.
Minimal Modic type 2 endplate degenerative changes.

Cord: Normal signal and morphology.

Posterior Fossa, vertebral arteries: Negative.

Disc levels: Mild desiccation.

C2-3: No significant disc bulge. Patent spinal canal and neural
foramen.

C3-4: Left predominant uncovertebral and facet degenerative
spurring. Patent spinal canal. Mild right and moderate left neural
foraminal narrowing.

C4-5: Disc osteophyte complex abutting the ventral cord with shallow
right foraminal protrusion. Uncovertebral and facet degenerative
spurring. Mild spinal canal, moderate right and mild left neural
foraminal narrowing.

C5-6: Disc osteophyte complex with uncovertebral and facet
degenerative spurring. Query small right foraminal protrusion. Mild
spinal canal and moderate bilateral neural foraminal narrowing.

C6-7: Disc osteophyte complex with superimposed right foraminal and
central protrusions. Uncovertebral and facet degenerative spurring.
Mild spinal canal, moderate right and mild left neural foraminal
narrowing.

C7-T1: No significant disc bulge. Right predominant uncovertebral
degenerative spurring. Mild right neural foraminal narrowing. Patent
spinal canal and left neural foramen.

Paraspinal tissues: Negative.
IMPRESSION: Multilevel spondylosis. Mild spinal canal narrowing at the C4-C7
levels.

Moderate left C3-4, right C4-5, bilateral C5-6 and right C6-7 neural
foraminal narrowing.

## 2022-10-13 ENCOUNTER — Inpatient Hospital Stay: Admission: RE | Admit: 2022-10-13 | Payer: Self-pay | Source: Ambulatory Visit

## 2023-02-21 ENCOUNTER — Ambulatory Visit
Admission: RE | Admit: 2023-02-21 | Discharge: 2023-02-21 | Disposition: A | Discharge status: Home / Self Care | Payer: BC Managed Care – PPO | Source: Ambulatory Visit | Attending: Emergency Medicine | Admitting: Emergency Medicine

## 2023-02-21 VITALS — BP 128/88 | HR 84 | Temp 97.8°F | Resp 18

## 2023-02-21 DIAGNOSIS — M62838 Other muscle spasm: Secondary | ICD-10-CM | POA: Diagnosis not present

## 2023-02-21 DIAGNOSIS — M436 Torticollis: Secondary | ICD-10-CM | POA: Diagnosis not present

## 2023-02-21 MED ORDER — METHOCARBAMOL 500 MG PO TABS: 500.0000 mg | ORAL_TABLET | Freq: Two times a day (BID) | ORAL | 0 refills | Status: DC | PRN

## 2023-02-21 MED ORDER — MELOXICAM 7.5 MG PO TABS: 7.5000 mg | ORAL_TABLET | Freq: Every day | ORAL | 0 refills | Status: AC

## 2023-02-21 NOTE — ED Triage Notes (Signed)
Patient to Urgent Care with complaints of neck and bilateral shoulder pain that started one week ago. Denies any known injury. Reports waking up with symptoms. Painful to move her neck.   Trying tylenol/ arthritis relief.

## 2023-02-21 NOTE — Discharge Instructions (Addendum)
Take meloxicam as directed.   Take methocarbamol as directed for muscle spasm; Do not drive, operate machinery, or drink alcohol with this medication as it can cause drowsiness.   Follow up with your primary care provider or an orthopedist if your symptoms are not improving.

## 2023-02-21 NOTE — ED Provider Notes (Signed)
[name]    CSN: [id number] Arrival date & time: 02/21/23  1554      History   Chief Complaint Chief Complaint  Patient presents with   Back Pain    Entered by patient    HPI Denise Olsen is a 52 y.o. female.  Patient presents with muscle pain and spasm in bilateral shoulders and neck x 1 week.  No falls or injury.  Patient states she thinks she slept in an awkward position which caused her pain.  Treating with Tylenol; none taken today.  No numbness, weakness, paresthesias, wounds, redness, bruising, back pain, shortness of breath, or other symptoms.  Her medical history includes diabetes.   The history is provided by the patient and medical records.    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      Home Medications    Prior to Admission medications   Medication Sig Start Date End Date Taking? Authorizing Provider  meloxicam (MOBIC) 7.5 MG tablet Take 1 tablet (7.5 mg total) by mouth daily for 14 days. 02/21/23 03/07/23 Yes Mickie Bail, NP  methocarbamol (ROBAXIN) 500 MG tablet Take 1 tablet (500 mg total) by mouth 2 (two) times daily as needed for muscle spasms. 02/21/23  Yes Mickie Bail, NP  albuterol (VENTOLIN HFA) 108 (90 Base) MCG/ACT inhaler Inhale 1-2 puffs into the lungs every 6 (six) hours as needed for wheezing or shortness of breath. 08/22/21   Bing Neighbors, NP  atorvastatin (LIPITOR) 40 MG tablet     [provider]  Blood Glucose Monitoring Suppl (GLUCOCOM BLOOD GLUCOSE MONITOR) DEVI Accu-Chek Guide Me Glucose Meter  USE AS DIRECTED    [provider]  Dapagliflozin-metFORMIN HCl ER (XIGDUO XR) 01-999 MG TB24 Take by mouth. 05/20/20   [provider]  escitalopram (LEXAPRO) 10 MG tablet Take by mouth. 10/22/21 10/22/22  [provider]  gabapentin (NEURONTIN) 300 MG capsule Take by mouth. 11/27/19   [provider]  glipiZIDE (GLUCOTROL XL) 10 MG 24 hr tablet Take 1 tablet by mouth daily. 03/07/16   [provider]    Family History History reviewed. No pertinent family history.  Social History Social History   Tobacco Use   Smoking status: Every Day   Smokeless tobacco: Never  Vaping Use   Vaping Use: Some days  Substance Use Topics   Alcohol use: Yes    Comment: Ocassionally   Drug use: Not Currently    Types: Marijuana     Allergies   Patient has no known allergies.   Review of Systems Review of Systems  Constitutional:  Negative for chills and fever.  Respiratory:  Negative for cough and shortness of breath.   Cardiovascular:  Negative for chest pain and palpitations.  Musculoskeletal:  Positive for arthralgias and neck pain. Negative for back pain, gait problem and joint  swelling.  Skin:  Negative for color change, rash and wound.  Neurological:  Negative for weakness and numbness.     Physical Exam Triage Vital Signs ED Triage Vitals  Enc Vitals Group     BP      Pulse      Resp      Temp      Temp src      SpO2      Weight      Height      Head Circumference      Peak Flow      Pain Score      Pain Loc      Pain Edu?      Excl. in GC?    No data found.  Updated Vital Signs BP 128/88   Pulse 84   Temp 97.8 F (36.6 C)   Resp 18   SpO2 98%   Visual Acuity Right Eye Distance:   Left Eye Distance:   Bilateral Distance:    Right Eye Near:    Left Eye Near:    Bilateral Near:     Physical Exam Vitals and nursing note reviewed.  Constitutional:      General: She is not in acute distress.    Appearance: Normal appearance. She is well-developed. She is not ill-appearing.  HENT:     Mouth/Throat:     Mouth: Mucous membranes are moist.  Cardiovascular:     Rate and Rhythm: Normal rate and regular rhythm.     Heart sounds: Normal heart sounds.  Pulmonary:     Effort: Pulmonary effort is normal. No respiratory distress.     Breath sounds: Normal breath sounds.  Musculoskeletal:        General: Tenderness present. No swelling, deformity or signs of injury. Normal range of motion.     Cervical back: Neck supple.     Comments: Muscular tenderness in bilateral shoulders and neck.  ROM in neck and shoulders limited by discomfort.   Skin:    General: Skin is warm and dry.     Capillary Refill: Capillary refill takes less than 2 seconds.     Findings: No bruising, erythema, lesion or rash.  Neurological:     General: No focal deficit present.     Mental Status: She is alert and oriented to person, place, and time.     Sensory: No sensory deficit.     Motor: No weakness.     Gait: Gait normal.  Psychiatric:        Mood and Affect: Mood normal.        Behavior: Behavior normal.      UC Treatments / Results  Labs (all labs ordered are listed, but only abnormal results are displayed) Labs Reviewed - No data to display  EKG   Radiology No results found.  Procedures Procedures (including critical care time)  Medications Ordered in UC Medications - No data to display  Initial Impression / Assessment and Plan / UC Course  I have reviewed the triage vital signs and the nursing notes.  Pertinent labs & imaging results that were available during my care of the patient were reviewed by me and considered in my medical decision making (see chart for details).    Muscle spasm, torticollis.  Patient has been symptomatic  for one week.  No trauma.  Treating today with meloxicam and methocarbamol.  Precautions for drowsiness with methocarbamol discussed.  Instructed patient to follow-up with her PCP or an  orthopedist if she is not improving.  Education provided on torticollis.  She agrees to plan of care.  Final Clinical Impressions(s) / UC Diagnoses   Final diagnoses:  Muscle spasm  Torticollis     Discharge Instructions      Take meloxicam as directed.   Take methocarbamol as directed for muscle spasm; Do not drive, operate machinery, or drink alcohol with this medication as it can cause drowsiness.   Follow up with your primary care provider or an orthopedist if your symptoms are not improving.         ED Prescriptions     Medication Sig Dispense Auth. Provider   meloxicam (MOBIC) 7.5 MG tablet Take 1 tablet (7.5 mg total) by mouth daily for 14 days. 14 tablet Mickie Bail, NP   methocarbamol (ROBAXIN) 500 MG tablet Take 1 tablet (500 mg total) by mouth 2 (two) times daily as needed for muscle spasms. 10 tablet Mickie Bail, NP      I have reviewed the PDMP during this encounter.   Mickie Bail, NP 02/21/23 [phone]

## 2023-04-24 ENCOUNTER — Ambulatory Visit: Payer: BC Managed Care – PPO | Admitting: Podiatry

## 2023-04-29 ENCOUNTER — Ambulatory Visit
Admission: RE | Admit: 2023-04-29 | Discharge: 2023-04-29 | Disposition: A | Discharge status: Home / Self Care | Payer: BC Managed Care – PPO | Source: Ambulatory Visit | Attending: Physician Assistant | Admitting: Physician Assistant

## 2023-04-29 ENCOUNTER — Other Ambulatory Visit: Payer: Self-pay

## 2023-04-29 VITALS — BP 144/79 | HR 89 | Temp 99.7°F | Resp 18

## 2023-04-29 DIAGNOSIS — E1140 Type 2 diabetes mellitus with diabetic neuropathy, unspecified: Secondary | ICD-10-CM

## 2023-04-29 DIAGNOSIS — E785 Hyperlipidemia, unspecified: Secondary | ICD-10-CM | POA: Diagnosis not present

## 2023-04-29 DIAGNOSIS — B353 Tinea pedis: Secondary | ICD-10-CM | POA: Diagnosis not present

## 2023-04-29 MED ORDER — GLIPIZIDE ER 10 MG PO TB24: 10.0000 mg | ORAL_TABLET | Freq: Every day | ORAL | 2 refills | Status: AC

## 2023-04-29 MED ORDER — GABAPENTIN 300 MG PO CAPS: 300.0000 mg | ORAL_CAPSULE | Freq: Every day | ORAL | 2 refills | Status: AC

## 2023-04-29 MED ORDER — ATORVASTATIN CALCIUM 40 MG PO TABS: 40.0000 mg | ORAL_TABLET | Freq: Every day | ORAL | 2 refills | Status: AC

## 2023-04-29 MED ORDER — DAPAGLIFLOZIN PRO-METFORMIN ER 5-1000 MG PO TB24: 1.0000 | ORAL_TABLET | Freq: Every day | ORAL | 2 refills | Status: AC

## 2023-04-29 MED ORDER — CLOTRIMAZOLE 1 % EX CREA: TOPICAL_CREAM | CUTANEOUS | 0 refills | Status: AC

## 2023-04-29 NOTE — Discharge Instructions (Addendum)
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  Fungal infections are very common uncontrolled diabetes. - I sent a cream for the athlete's foot. - I sent 52-month supply of your diabetes medications and also refilled your atorvastatin cholesterol medicine. - You were on gabapentin at 1 point.  This should help with nerve type pain.  It is possible you may need to have your dose increased but I sent this medication for you as well. - It is imperative that you contact your primary care office tomorrow and try to get an appointment ASAP. - You can take Tylenol as needed for discomfort as well. - Make sure you are exercising regularly, trying to lose weight, watching your sugar and carbohydrate intake.

## 2023-04-29 NOTE — ED Provider Notes (Signed)
[facility]    CSN: [id number] Arrival date & time: 04/29/23  1525      History   Chief Complaint Chief Complaint  Patient presents with   Foot Pain   Appointment    4:00 pm    HPI Denise Olsen is a 52 y.o. female with history of type 2 diabetes, tobacco abuse, and hyperlipidemia. She presents today for itching and burning of the plantar feet x 1-2 weeks. She thinks symptoms are due to a recent pedicure. Says they "dug in too deep." She has no previous diagnosis of diabetic neuropathy but does state that she has not taken diabetes medicine in nearly a year since she ran out and her PCP would not refill it without a visit. Has tried OTC  meds for pain without relief.   HPI  Past Medical History:  Diagnosis Date   Diabetes mellitus without complication 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      Home Medications    Prior to Admission medications   Medication Sig Start Date End Date Taking? Authorizing Provider  atorvastatin (LIPITOR) 40 MG tablet Take 1 tablet (40 mg total) by mouth daily. 04/29/23 05/29/23 Yes Shirlee Latch, PA-C  clotrimazole (LOTRIMIN) 1 % cream Apply to affected area 2 times daily x 10-14 days 04/29/23  Yes Shirlee Latch, PA-C  Dapagliflozin Pro-metFORMIN ER (XIGDUO XR) 01-999 MG TB24 Take 1 tablet by mouth daily. 04/29/23 05/29/23 Yes Shirlee Latch, PA-C  albuterol (VENTOLIN HFA) 108 (90 Base) MCG/ACT inhaler Inhale 1-2 puffs  into the lungs every 6 (six) hours as needed for wheezing or shortness of breath. 08/22/21   Bing Neighbors, NP  Blood Glucose Monitoring Suppl (GLUCOCOM BLOOD GLUCOSE MONITOR) DEVI Accu-Chek Guide Me Glucose Meter  USE AS DIRECTED    [provider]  escitalopram (LEXAPRO) 10 MG tablet Take by mouth. Patient not taking: Reported on 04/29/2023 10/22/21 10/22/22  [provider]  gabapentin (NEURONTIN) 300 MG capsule Take 1 capsule (300 mg total) by mouth daily. 04/29/23 05/29/23  Eusebio Friendly B, PA-C  glipiZIDE (GLUCOTROL XL) 10 MG 24 hr tablet Take 1 tablet (10 mg total) by mouth daily. 04/29/23 05/29/23  Shirlee Latch, PA-C    Family History History reviewed. No pertinent family history.  Social History Social History   Tobacco Use   Smoking status: Every Day   Smokeless tobacco: Never  Vaping Use   Vaping status: Former  Substance Use Topics   Alcohol use: Yes    Comment: Ocassionally   Drug use: Not Currently    Types: Marijuana     Allergies   Patient has no known allergies.   Review of Systems Review of Systems  Musculoskeletal:  Positive for arthralgias. Negative for joint swelling.  Skin:  Negative for color change and wound.  Neurological:  Negative for weakness and numbness.     Physical Exam Triage Vital Signs ED Triage  Vitals  Encounter Vitals Group     BP 04/29/23 1607 (!) 144/79     Systolic BP Percentile --      Diastolic BP Percentile --      Pulse Rate 04/29/23 1607 89     Resp 04/29/23 1607 18     Temp 04/29/23 1607 99.7 F (37.6 C)     Temp Source 04/29/23 1607 Oral     SpO2 04/29/23 1607 100 %     Weight --      Height --      Head Circumference --      Peak Flow --      Pain Score 04/29/23 1604 6     Pain Loc --      Pain Education --      Exclude from Growth Chart --    No data found.  Updated Vital Signs BP (!) 144/79 (BP Location: Left Arm)   Pulse 89   Temp 99.7 F (37.6 C) (Oral)   Resp 18   SpO2 100%       Physical Exam Vitals and nursing note reviewed.  Constitutional:      General: She is not in acute distress.    Appearance: Normal appearance. She is not ill-appearing or toxic-appearing.  HENT:     Head: Normocephalic and atraumatic.  Eyes:     General: No scleral icterus.       Right eye: No discharge.        Left eye: No discharge.     Conjunctiva/sclera: Conjunctivae normal.  Cardiovascular:     Rate and Rhythm: Normal rate and regular rhythm.     Heart sounds: Normal heart sounds.  Pulmonary:     Effort: Pulmonary effort is normal. No respiratory distress.     Breath sounds: Normal breath sounds.  Musculoskeletal:     Cervical back: Neck supple.  Skin:    General: Skin is dry.     Comments: White discoloration, macerated skin of toes of left foot.   Neurological:     General: No focal deficit present.     Mental Status: She is alert. Mental status is at baseline.     Motor: No weakness.     Gait: Gait normal.  Psychiatric:        Mood and Affect: Mood normal.        Behavior: Behavior normal.        Thought Content: Thought content normal.      UC Treatments / Results  Labs (all labs ordered are listed, but only abnormal results are displayed) Labs Reviewed - No data to display  EKG   Radiology No results found.  Procedures Procedures (including critical care time)  Medications Ordered in UC Medications - No data to display  Initial Impression / Assessment and Plan / UC Course  I have reviewed the triage vital signs and the nursing notes.  Pertinent labs & imaging results that were available during my care of the patient were reviewed by me and considered in my medical decision making (see chart for details).   52 year old female with history of type 2 diabetes (not on medication for nearly a year), hyperlipidemia and tobacco abuse presents for 1 to 2-week history of itching and burning of the plantar feet.  Also reports white discoloration  around the skin of the toes of the left foot.  Last A1c 07/16/2022 was 9.6.  Patient previously on glipizide, Xigduo, gabapentin and atorvastatin.  Has been off all of  these medications for several months.  Advised patient symptoms are due to uncontrolled diabetes which I am sure is much more uncontrolled at this time since she has been unmedicated.  Discussed her restarting her diabetes medications and contacting her PCP ASAP for an appointment.  Patient would like to do this.  Refilled diabetes medications.  She also has tinea pedis so I sent clotrimazole.  Sent gabapentin for pain.  She was previously on this but does not remember why.  Discussed dietary changes and lifestyle modifications.  Discussed the importance of following up with PCP to properly manage this condition.  ED precautions given.   Final Clinical Impressions(s) / UC Diagnoses   Final diagnoses:  Type 2 diabetes mellitus with diabetic neuropathy, without long-term current use of insulin (HCC)  Tinea pedis of left foot  Hyperlipidemia, unspecified hyperlipidemia type     Discharge Instructions      -Your symptoms are likely due to diabetic neuropathy since you have been noncompliant with your medications and your A1c was also very high last year some short even higher now. 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  Fungal infections are very common uncontrolled diabetes. - I sent a cream for the athlete's foot. - I sent 11-month supply of your diabetes medications and also refilled your atorvastatin cholesterol medicine. - You were on gabapentin at 1 point.  This should help with nerve type pain.  It is possible you may need to have your dose increased but I sent this medication for you as well. - It is imperative that you contact your primary care office tomorrow and try to get an appointment ASAP. - You can take Tylenol as needed for discomfort as well. - Make sure you are exercising regularly, trying to lose  weight, watching your sugar and carbohydrate intake.     ED Prescriptions     Medication Sig Dispense Auth. Provider   gabapentin (NEURONTIN) 300 MG capsule Take 1 capsule (300 mg total) by mouth daily. 30 capsule Eusebio Friendly B, PA-C   Dapagliflozin Pro-metFORMIN ER (XIGDUO XR) 01-999 MG TB24 Take 1 tablet by mouth daily. 30 tablet Eusebio Friendly B, PA-C   atorvastatin (LIPITOR) 40 MG tablet Take 1 tablet (40 mg total) by mouth daily. 30 tablet Eusebio Friendly B, PA-C   glipiZIDE (GLUCOTROL XL) 10 MG 24 hr tablet Take 1 tablet (10 mg total) by mouth daily. 30 tablet Eusebio Friendly B, PA-C   clotrimazole (LOTRIMIN) 1 % cream Apply to affected area 2 times daily x 10-14 days 28 g Shirlee Latch, PA-C      PDMP not reviewed this encounter.   Shirlee Latch, PA-C 04/29/23 [phone]

## 2023-04-29 NOTE — ED Triage Notes (Addendum)
Bottoms of both feet itch and burn for 1-2 weeks ago.  And first 3 toes on left foot are painful.  Patient is concerned all symptoms are related to having a pedicure recently Patient is not taking any of her medications-including diabetic medicines Patient has taken a tylenol today

## 2023-04-30 ENCOUNTER — Ambulatory Visit
Admission: RE | Admit: 2023-04-30 | Discharge: 2023-04-30 | Disposition: A | Discharge status: Home / Self Care | Payer: BC Managed Care – PPO | Source: Ambulatory Visit | Attending: Family Medicine | Admitting: Family Medicine

## 2023-04-30 VITALS — BP 118/77 | HR 82 | Temp 98.8°F | Resp 17

## 2023-04-30 DIAGNOSIS — U071 COVID-19: Secondary | ICD-10-CM | POA: Diagnosis present

## 2023-04-30 DIAGNOSIS — J069 Acute upper respiratory infection, unspecified: Secondary | ICD-10-CM | POA: Diagnosis present

## 2023-04-30 NOTE — ED Triage Notes (Signed)
Patient presents to UC for chills, an episode of  vomiting, HA since yesterday. Body aches x 3 days. Tested positive for COVID yesterday. Has a newborn at home req another covid test.

## 2023-04-30 NOTE — ED Provider Notes (Signed)
[name]    CSN: [id number] Arrival date & time: 04/30/23  2841      History   Chief Complaint Chief Complaint  Patient presents with   Chills    positive covid at home test , need official testing - Entered by patient   Headache    HPI Denise Olsen is a 52 y.o. female.   Patient present today with headache, chills, fatigue after testing positive for COVID yesterday.  Symptoms have been present for approximately 3 to 4 days.  She is not having cough however endorses some nasal congestion.  Patient denies any difficulty breathing or chest tightness.  Patient is high risk for complications associated with COVID due to hyperlipidemia and Type 2 diabetes.   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      Home Medications    Prior to Admission medications   Medication Sig Start Date End Date Taking? Authorizing Provider  albuterol (VENTOLIN HFA) 108 (90 Base) MCG/ACT inhaler Inhale 1-2 puffs into the lungs every 6 (six) hours as needed for wheezing or shortness of breath. 08/22/21   Bing Neighbors, NP  atorvastatin (LIPITOR) 40 MG tablet Take 1 tablet (40 mg total) by mouth daily. 04/29/23 05/29/23  Shirlee Latch, PA-C  Blood Glucose Monitoring Suppl (GLUCOCOM BLOOD GLUCOSE MONITOR) DEVI Accu-Chek Guide Me Glucose Meter  USE AS DIRECTED    [provider]   clotrimazole (LOTRIMIN) 1 % cream Apply to affected area 2 times daily x 10-14 days 04/29/23   Shirlee Latch, PA-C  Dapagliflozin Pro-metFORMIN ER (XIGDUO XR) 01-999 MG TB24 Take 1 tablet by mouth daily. 04/29/23 05/29/23  Eusebio Friendly B, PA-C  escitalopram (LEXAPRO) 10 MG tablet Take by mouth. Patient not taking: Reported on 04/29/2023 10/22/21 10/22/22  [provider]  gabapentin (NEURONTIN) 300 MG capsule Take 1 capsule (300 mg total) by mouth daily. 04/29/23 05/29/23  Eusebio Friendly B, PA-C  glipiZIDE (GLUCOTROL XL) 10 MG 24 hr tablet Take 1 tablet (10 mg total) by mouth daily. 04/29/23 05/29/23  Shirlee Latch, PA-C    Family History History reviewed. No pertinent family history.  Social History Social History   Tobacco Use   Smoking status: Every Day   Smokeless tobacco: Never  Vaping Use   Vaping status: Former  Substance Use Topics   Alcohol use: Yes    Comment: Ocassionally   Drug use: Not Currently    Types: Marijuana     Allergies   Oxycodone-acetaminophen   Review of Systems Review of Systems Pertinent negatives listed in HPI  Physical Exam Triage Vital Signs ED Triage Vitals  Encounter Vitals Group     BP 04/30/23 1021 118/77     Systolic BP Percentile --      Diastolic BP Percentile --      Pulse Rate 04/30/23 1021 82  Resp 04/30/23 1021 17     Temp 04/30/23 1021 98.8 F (37.1 C)     Temp Source 04/30/23 1021 Oral     SpO2 04/30/23 1021 98 %     Weight --      Height --      Head Circumference --      Peak Flow --      Pain Score 04/30/23 1121 0     Pain Loc --      Pain Education --      Exclude from Growth Chart --    No data found.  Updated Vital Signs BP 118/77 (BP Location: Left Arm)   Pulse 82   Temp 98.8 F (37.1 C) (Oral)   Resp 17   SpO2 98%   Visual Acuity Right Eye Distance:   Left Eye Distance:   Bilateral Distance:    Right Eye Near:   Left Eye Near:    Bilateral Near:     Physical Exam Constitutional:       Appearance: She is well-developed. She is ill-appearing.  HENT:     Right Ear: There is no impacted cerumen.     Left Ear: There is no impacted cerumen.     Nose: Congestion and rhinorrhea present.     Mouth/Throat:     Mouth: Mucous membranes are moist.     Pharynx: No oropharyngeal exudate or posterior oropharyngeal erythema.  Eyes:     Extraocular Movements: Extraocular movements intact.     Pupils: Pupils are equal, round, and reactive to light.  Cardiovascular:     Rate and Rhythm: Normal rate and regular rhythm.  Pulmonary:     Effort: Pulmonary effort is normal. No respiratory distress.     Breath sounds: Normal breath sounds. No stridor.  Musculoskeletal:        General: Normal range of motion.     Cervical back: Normal range of motion and neck supple.  Lymphadenopathy:     Cervical: No cervical adenopathy.  Skin:    General: Skin is warm and dry.  Neurological:     General: No focal deficit present.     Mental Status: She is alert.     GCS: GCS eye subscore is 4. GCS verbal subscore is 5. GCS motor subscore is 6.    UC Treatments / Results  Labs (all labs ordered are listed, but only abnormal results are displayed) Labs Reviewed  SARS CORONAVIRUS 2 (TAT 6-24 HRS)  BASIC METABOLIC PANEL    EKG   Radiology No results found.  Procedures Procedures (including critical care time)  Medications Ordered in UC Medications - No data to display  Initial Impression / Assessment and Plan / UC Course  I have reviewed the triage vital signs and the nursing notes.  Pertinent labs & imaging results that were available during my care of the patient were reviewed by me and considered in my medical decision making (see chart for details).     COVID test pending. Symptom management warranted only.  Manage fever with Tylenol and ibuprofen.  Nasal symptoms with over-the-counter antihistamines recommended.  Treatment per discharge medications/discharge instructions.  Patient  would benefit from Paxlovid, obtain a BMP to check for renal function to confirm appropriate dose of Paxlovid. Final Clinical Impressions(s) / UC Diagnoses   Final diagnoses:  Positive self-administered antigen test for COVID-19  Viral URI with cough     Discharge Instructions      I am collecting a lab test to  check your renal function as renal function has to be verified in order to start Paxlovid.  As long as renal function was stable I will send Paxlovid to the pharmacy for you tomorrow.  If you have not received a notification from your pharmacy by noon call the office.  Continue to mask at home to prevent spread of virus.  Wipe things down with bleach and spray with Lysol this is been helpful in preventing virus from spreading to others in the household.    ED Prescriptions   None    PDMP not reviewed this encounter.   Bing Neighbors, NP 04/30/23 [phone]

## 2023-04-30 NOTE — Discharge Instructions (Signed)
I am collecting a lab test to check your renal function as renal function has to be verified in order to start Paxlovid.  As long as renal function was stable I will send Paxlovid to the pharmacy for you tomorrow.  If you have not received a notification from your pharmacy by noon call the office.  Continue to mask at home to prevent spread of virus.  Wipe things down with bleach and spray with Lysol this is been helpful in preventing virus from spreading to others in the household.

## 2023-05-01 ENCOUNTER — Telehealth (HOSPITAL_COMMUNITY): Payer: Self-pay | Admitting: Family Medicine

## 2023-05-01 LAB — BASIC METABOLIC PANEL
BUN/Creatinine Ratio: 11 (ref 9–23)
BUN: 8 mg/dL (ref 6–24)
CO2: 19 mmol/L — ABNORMAL LOW (ref 20–29)
Calcium: 9 mg/dL (ref 8.7–10.2)
Chloride: 103 mmol/L (ref 96–106)
Creatinine, Ser: 0.73 mg/dL (ref 0.57–1.00)
Glucose: 156 mg/dL — ABNORMAL HIGH (ref 70–99)
Potassium: 4.1 mmol/L (ref 3.5–5.2)
Sodium: 140 mmol/L (ref 134–144)
eGFR: 100 mL/min/{1.73_m2} (ref >59)

## 2023-05-01 LAB — SARS CORONAVIRUS 2 (TAT 6-24 HRS): SARS Coronavirus 2: POSITIVE — AB

## 2023-05-01 MED ORDER — PAXLOVID (300/100) 20 X 150 MG & 10 X 100MG PO TBPK: 3.0000 | ORAL_TABLET | Freq: Two times a day (BID) | ORAL | 0 refills | Status: AC

## 2023-05-01 NOTE — Telephone Encounter (Signed)
Positive COVID test E prescribe Paxlovid

## 2023-05-08 ENCOUNTER — Ambulatory Visit
Admission: RE | Admit: 2023-05-08 | Discharge: 2023-05-08 | Disposition: A | Discharge status: Home / Self Care | Payer: BC Managed Care – PPO | Source: Ambulatory Visit | Attending: Emergency Medicine | Admitting: Emergency Medicine

## 2023-05-08 ENCOUNTER — Ambulatory Visit: Payer: Self-pay

## 2023-05-08 VITALS — BP 127/79 | HR 73 | Temp 97.8°F | Resp 18

## 2023-05-08 DIAGNOSIS — Z8639 Personal history of other endocrine, nutritional and metabolic disease: Secondary | ICD-10-CM | POA: Diagnosis not present

## 2023-05-08 DIAGNOSIS — M79671 Pain in right foot: Secondary | ICD-10-CM | POA: Diagnosis not present

## 2023-05-08 DIAGNOSIS — F17200 Nicotine dependence, unspecified, uncomplicated: Secondary | ICD-10-CM

## 2023-05-08 DIAGNOSIS — M79672 Pain in left foot: Secondary | ICD-10-CM

## 2023-05-08 DIAGNOSIS — B353 Tinea pedis: Secondary | ICD-10-CM | POA: Diagnosis not present

## 2023-05-08 LAB — POCT FASTING CBG KUC MANUAL ENTRY: POCT Glucose (KUC): 170 mg/dL — AB (ref 70–99)

## 2023-05-08 NOTE — ED Triage Notes (Addendum)
Patient to Urgent Care with complaints of bilateral foot pain. Describes sharp and shooting pain to the soles of her feet. Blister present to the lateral side of her right foot.  Symptoms started one week ago after a pedicure. Painful to walk. Seen at another urgent care on Monday. Has been using clotrimazole cream but stopped using the gabapentin d/t nausea.   Patient diabetic. Stands on feet all day. Podiatrist appointment not until next Thursday.

## 2023-05-08 NOTE — ED Provider Notes (Signed)
[name]    CSN: [id number] Arrival date & time: 05/08/23  5638      History   Chief Complaint Chief Complaint  Patient presents with   Foot Pain    Entered by patient    HPI MALENA SHIEH is a 52 y.o. female.   SCOTLYN GOTSHALL is a 52 y.o. female with history of type 2 diabetes, tobacco abuse, and hyperlipidemia. She presents today for burning of the plantar feet x 2.5 weeks. States she got recent pedicure and they scrubbed and "dug in too deep." She has no previous diagnosis of diabetic neuropathy but does state that she has not taken diabetes medicine in nearly a year since she ran out and her PCP would not refill it without a visit. Pt was seen recently at URgent care and prescribed lotromin cream and gabapentin,pt states gabapentin makes her nauseated so she stopped taking it. Has appt with Dr. Clide Cliff podiatrist next week.  The history is provided by the patient. No language interpreter was used.    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      Home Medications    Prior to Admission medications   Medication Sig Start Date End Date Taking? Authorizing Provider  albuterol (VENTOLIN HFA) 108 (90 Base) MCG/ACT inhaler Inhale 1-2  puffs into the lungs every 6 (six) hours as needed for wheezing or shortness of breath. 08/22/21   Bing Neighbors, NP  atorvastatin (LIPITOR) 40 MG tablet Take 1 tablet (40 mg total) by mouth daily. 04/29/23 05/29/23  Shirlee Latch, PA-C  Blood Glucose Monitoring Suppl (GLUCOCOM BLOOD GLUCOSE MONITOR) DEVI Accu-Chek Guide Me Glucose Meter  USE AS DIRECTED    [provider]  clotrimazole (LOTRIMIN) 1 % cream Apply to affected area 2 times daily x 10-14 days 04/29/23   Shirlee Latch, PA-C  Dapagliflozin Pro-metFORMIN ER (XIGDUO XR) 01-999 MG TB24 Take 1 tablet by mouth daily. 04/29/23 05/29/23  Eusebio Friendly B, PA-C  escitalopram (LEXAPRO) 10 MG tablet Take by mouth. Patient not taking: Reported on 04/29/2023 10/22/21 10/22/22  [provider]  gabapentin (NEURONTIN) 300 MG capsule Take 1 capsule (300 mg total) by mouth daily. 04/29/23 05/29/23  Eusebio Friendly B, PA-C  glipiZIDE (GLUCOTROL XL) 10 MG 24 hr tablet Take 1 tablet (10 mg total) by mouth daily. 04/29/23 05/29/23  Shirlee Latch, PA-C    Family History History reviewed. No pertinent family history.  Social History Social History   Tobacco Use   Smoking status: Every Day   Smokeless tobacco: Never  Vaping Use   Vaping status: Former  Substance Use Topics   Alcohol use: Yes    Comment: Ocassionally   Drug use:  Not Currently    Types: Marijuana     Allergies   Oxycodone-acetaminophen   Review of Systems Review of Systems  Constitutional:  Negative for fever.  Skin:  Positive for wound.  All other systems reviewed and are negative.    Physical Exam Triage Vital Signs ED Triage Vitals  Encounter Vitals Group     BP      Systolic BP Percentile      Diastolic BP Percentile      Pulse      Resp      Temp      Temp src      SpO2      Weight      Height      Head Circumference      Peak Flow      Pain Score      Pain Loc      Pain Education      Exclude from Growth Chart    No data  found.  Updated Vital Signs BP 127/79 (BP Location: Left Arm)   Pulse 73   Temp 97.8 F (36.6 C) (Oral)   Resp 18   SpO2 95%   Visual Acuity Right Eye Distance:   Left Eye Distance:   Bilateral Distance:    Right Eye Near:   Left Eye Near:    Bilateral Near:     Physical Exam Vitals and nursing note reviewed.  Constitutional:      Appearance: Normal appearance. She is well-developed and well-groomed.  HENT:     Head: Normocephalic.  Cardiovascular:     Pulses:          Dorsalis pedis pulses are 2+ on the right side and 2+ on the left side.  Pulmonary:     Effort: Pulmonary effort is normal.  Feet:     Right foot:     Skin integrity: Blister present.     Comments: White discoloration, macerated skin of toes of left foot.  Right lateral mid foot dime size blister noted, no purulent drainage. Skin:    General: Skin is warm.     Capillary Refill: Capillary refill takes less than 2 seconds.  Neurological:     General: No focal deficit present.     Mental Status: She is alert and oriented to person, place, and time.     GCS: GCS eye subscore is 4. GCS verbal subscore is 5. GCS motor subscore is 6.  Psychiatric:        Attention and Perception: Attention normal.        Mood and Affect: Mood normal.        Speech: Speech normal.        Behavior: Behavior normal. Behavior is cooperative.      UC Treatments / Results  Labs (all labs ordered are listed, but only abnormal results are displayed) Labs Reviewed  POCT FASTING CBG [facility] MANUAL ENTRY - Abnormal; Notable for the following components:      Result Value   POCT Glucose ([facility]) 170 (*)    All other components within normal limits    EKG   Radiology No results found.  Procedures Procedures (including critical care time)  Medications Ordered in UC Medications - No data to display  Initial Impression / Assessment and Plan / UC Course  I have reviewed the triage vital signs and the nursing  notes.  Pertinent labs & imaging results that were available during my care of the patient were reviewed by  me and considered in my medical decision making (see chart for details).  Clinical Course as of 05/08/23 1040  Wed May 08, 2023  1013 Cbg 170 in urgent care [JD]    Clinical Course User Index [JD] Lysander Calixte, Para March, NP    Ddx: Diabetic neuropathy, tinea pedis, callus Final Clinical Impressions(s) / UC Diagnoses   Final diagnoses:  Bilateral foot pain  History of diabetes mellitus, type II  Tinea pedis of left foot  Smoker     Discharge Instructions      Continue Lotrimin as prescribed. May take tylenol for pain as label directed. Please follow up with Dr. Clide Cliff your podiatrist. Do not recommend pedicures due to diabetes, have podiatrist cut nails only.      ED Prescriptions   None    PDMP not reviewed this encounter.   Clancy Gourd, NP 05/08/23 1040

## 2023-05-08 NOTE — Discharge Instructions (Addendum)
Continue Lotrimin as prescribed. May take tylenol for pain as label directed. Please follow up with Dr. Clide Cliff your podiatrist. Do not recommend pedicures due to diabetes, have podiatrist cut nails only.

## 2023-10-21 ENCOUNTER — Emergency Department
Admission: EM | Admit: 2023-10-21 | Discharge: 2023-10-21 | Disposition: A | Discharge status: Home / Self Care | Payer: BC Managed Care – PPO | Attending: Emergency Medicine | Admitting: Emergency Medicine

## 2023-10-21 ENCOUNTER — Other Ambulatory Visit: Payer: Self-pay

## 2023-10-21 DIAGNOSIS — E119 Type 2 diabetes mellitus without complications: Secondary | ICD-10-CM | POA: Insufficient documentation

## 2023-10-21 DIAGNOSIS — R112 Nausea with vomiting, unspecified: Secondary | ICD-10-CM | POA: Diagnosis present

## 2023-10-21 DIAGNOSIS — K859 Acute pancreatitis without necrosis or infection, unspecified: Secondary | ICD-10-CM | POA: Insufficient documentation

## 2023-10-21 LAB — CBG MONITORING, ED: Glucose-Capillary: 171 mg/dL — ABNORMAL HIGH (ref 70–99)

## 2023-10-21 LAB — COMPREHENSIVE METABOLIC PANEL
ALT: 22 U/L (ref 0–44)
AST: 27 U/L (ref 15–41)
Albumin: 4.4 g/dL (ref 3.5–5.0)
Alkaline Phosphatase: 103 U/L (ref 38–126)
Anion gap: 10 (ref 5–15)
BUN: 14 mg/dL (ref 6–20)
CO2: 24 mmol/L (ref 22–32)
Calcium: 9.5 mg/dL (ref 8.9–10.3)
Chloride: 108 mmol/L (ref 98–111)
Creatinine, Ser: 0.63 mg/dL (ref 0.44–1.00)
GFR, Estimated: >60 mL/min (ref >60)
Glucose, Bld: 203 mg/dL — ABNORMAL HIGH (ref 70–99)
Potassium: 3.7 mmol/L (ref 3.5–5.1)
Sodium: 142 mmol/L (ref 135–145)
Total Bilirubin: 0.5 mg/dL (ref 0.0–1.2)
Total Protein: 8.5 g/dL — ABNORMAL HIGH (ref 6.5–8.1)

## 2023-10-21 LAB — CBC
HCT: 45.9 % (ref 36.0–46.0)
Hemoglobin: 14.4 g/dL (ref 12.0–15.0)
MCH: 26.4 pg (ref 26.0–34.0)
MCHC: 31.4 g/dL (ref 30.0–36.0)
MCV: 84.2 fL (ref 80.0–100.0)
Platelets: 271 10*3/uL (ref 150–400)
RBC: 5.45 MIL/uL — ABNORMAL HIGH (ref 3.87–5.11)
RDW: 13.9 % (ref 11.5–15.5)
WBC: 10.8 10*3/uL — ABNORMAL HIGH (ref 4.0–10.5)
nRBC: 0 % (ref 0.0–0.2)

## 2023-10-21 LAB — LIPASE, BLOOD: Lipase: 245 U/L — ABNORMAL HIGH (ref 11–51)

## 2023-10-21 MED ORDER — MORPHINE SULFATE (PF) 4 MG/ML IV SOLN
4.0000 mg | Freq: Once | INTRAVENOUS | Status: AC
  Administered 2023-10-21: 4 mg via INTRAVENOUS
  Filled 2023-10-21: qty 1

## 2023-10-21 MED ORDER — ONDANSETRON HCL 4 MG/2ML IJ SOLN
4.0000 mg | Freq: Once | INTRAMUSCULAR | Status: AC
  Administered 2023-10-21: 4 mg via INTRAVENOUS
  Filled 2023-10-21: qty 2

## 2023-10-21 MED ORDER — ONDANSETRON HCL 4 MG PO TABS: 4.0000 mg | ORAL_TABLET | Freq: Three times a day (TID) | ORAL | 0 refills | Status: AC | PRN

## 2023-10-21 MED ORDER — SODIUM CHLORIDE 0.9 % IV BOLUS
1000.0000 mL | Freq: Once | INTRAVENOUS | Status: AC
  Administered 2023-10-21: 1000 mL via INTRAVENOUS

## 2023-10-21 NOTE — ED Triage Notes (Signed)
Pt comes with c/o n.v and diarrhea for two days. Pt states belly pain.

## 2023-10-21 NOTE — ED Provider Notes (Signed)
[facility] Provider Note    Event Date/Time   First MD Initiated Contact with Patient 10/21/23 1639     (approximate)   History   Abdominal Pain   HPI Denise Olsen is a 53 y.o. female with history of DM2, HLD presenting today for nausea vomiting and diarrhea.  Patient states yesterday eating at [employer] and afterwards having sudden onset of nausea, vomiting, and diarrhea.  Feels most of her pain in the epigastric region that started after her vomiting episodes.  Otherwise denies fever, chills, chest pain, shortness of breath, dysuria.  Has not noticed any blood in her stools or vomit.  No prior history of abdominal surgeries.  No alcohol use.  No other recent sick contacts that she is aware of.     Physical Exam   Triage Vital Signs: ED Triage Vitals  Encounter Vitals Group     BP 10/21/23 1103 (!) 144/106     Systolic BP Percentile --      Diastolic BP Percentile --      Pulse Rate 10/21/23 1103 67     Resp 10/21/23 1103 18     Temp 10/21/23 1103 98 F (36.7 C)     Temp src --      SpO2 10/21/23 1103 98 %     Weight 10/21/23 1101 150 lb (68 kg)     Height 10/21/23 1101 5\' 4"  (1.626 m)     Head Circumference --      Peak Flow --      Pain Score 10/21/23 1101 10     Pain Loc --      Pain Education --      Exclude from Growth Chart --     Most recent vital signs: Vitals:   10/21/23 1103 10/21/23 1652  BP: (!) 144/106 (!) 155/87  Pulse: 67 76  Resp: 18 19  Temp: 98 F (36.7 C) 98.5 F (36.9 C)  SpO2: 98% 100%   Physical Exam: I have reviewed the vital signs and nursing notes. General: Awake, alert, no acute distress.  Nontoxic appearing. Head:  Atraumatic, normocephalic.   ENT:  EOM intact, PERRL. Oral mucosa is pink and moist with no lesions. Neck: Neck is supple with full range of motion, No meningeal signs. Cardiovascular:  RRR, No murmurs. Peripheral pulses palpable and equal bilaterally. Respiratory:  Symmetrical chest  wall expansion.  No rhonchi, rales, or wheezes.  Good air movement throughout.  No use of accessory muscles.   Musculoskeletal:  No cyanosis or edema. Moving extremities with full ROM Abdomen:  Soft, tenderness to palpation in the epigastric region, nondistended. Neuro:  GCS 15, moving all four extremities, interacting appropriately. Speech clear. Psych:  Calm, appropriate.   Skin:  Warm, dry, no rash.    ED Results / Procedures / Treatments   Labs (all labs ordered are listed, but only abnormal results are displayed) Labs Reviewed  LIPASE, BLOOD - Abnormal; Notable for the following components:      Result Value   Lipase 245 (*)    All other components within normal limits  COMPREHENSIVE METABOLIC PANEL - Abnormal; Notable for the following components:   Glucose, Bld 203 (*)    Total Protein 8.5 (*)    All other components within normal limits  CBC - Abnormal; Notable for the following components:   WBC 10.8 (*)    RBC 5.45 (*)    All other components within normal limits  CBG MONITORING, ED -  Abnormal; Notable for the following components:   Glucose-Capillary 171 (*)    All other components within normal limits     EKG    RADIOLOGY    PROCEDURES:  Critical Care performed: No  Procedures   MEDICATIONS ORDERED IN ED: Medications  sodium chloride 0.9 % bolus 1,000 mL (1,000 mLs Intravenous New Bag/Given 10/21/23 1725)  ondansetron (ZOFRAN) injection 4 mg (4 mg Intravenous Given 10/21/23 1725)  morphine (PF) 4 MG/ML injection 4 mg (4 mg Intravenous Given 10/21/23 1726)     IMPRESSION / MDM / ASSESSMENT AND PLAN / ED COURSE  I reviewed the triage vital signs and the nursing notes.                              Differential diagnosis includes, but is not limited to, pancreatitis, viral gastroenteritis, food poisoning, colitis  Patient's presentation is most consistent with acute complicated illness / injury requiring diagnostic workup.  Patient is a 53 year old  female presenting today for 1 day of nausea, vomiting, diarrhea and epigastric pain.  Symptoms followed eating at a restaurant.  She does have some tenderness in the epigastric region with mild lipase elevation.  Could be due to acute pancreatitis versus her recurrent vomiting symptoms.  Vital signs otherwise stable.  Patient given 1 L fluids, morphine, and Zofran.  Reassessed with complete symptomatic resolution and tolerating p.o.  Patient is stable for discharge and will send her with Zofran as needed.  Given strict return precautions and told to follow-up with PCP.  Clinical Course as of 10/21/23 1941  Mon Oct 21, 2023  1900 Feeling significantly better. Will PO challenge and discharge if she tolerates it [DW]    Clinical Course User Index [DW] Janith Lima, MD     FINAL CLINICAL IMPRESSION(S) / ED DIAGNOSES   Final diagnoses:  Acute pancreatitis, unspecified complication status, unspecified pancreatitis type  Nausea vomiting and diarrhea     Rx / DC Orders   ED Discharge Orders          Ordered    ondansetron (ZOFRAN) 4 MG tablet  Every 8 hours PRN        10/21/23 1941             Note:  This document was prepared using Dragon voice recognition software and may include unintentional dictation errors.   Janith Lima, MD 10/21/23 [phone]

## 2023-10-21 NOTE — Discharge Instructions (Signed)
I suspect your symptoms today are possibly either due to food poisoning or a viral GI infection causing some inflammation to your pancreas.  Please take the nausea medication at home to help with symptoms.  You can also take ibuprofen and Tylenol as needed for pain.  Please follow-up with your primary care provider as needed or return for any severe worsening symptoms.

## 2023-12-25 IMAGING — DX DG FOOT COMPLETE 3+V*R*
3 series · 3 of 3 positions shown · non-contrast
Comparison: None Available.

CLINICAL DATA: Right foot pain and swelling beginning yesterday.

EXAM:
RIGHT FOOT COMPLETE - 3+ VIEW

[foot ap]
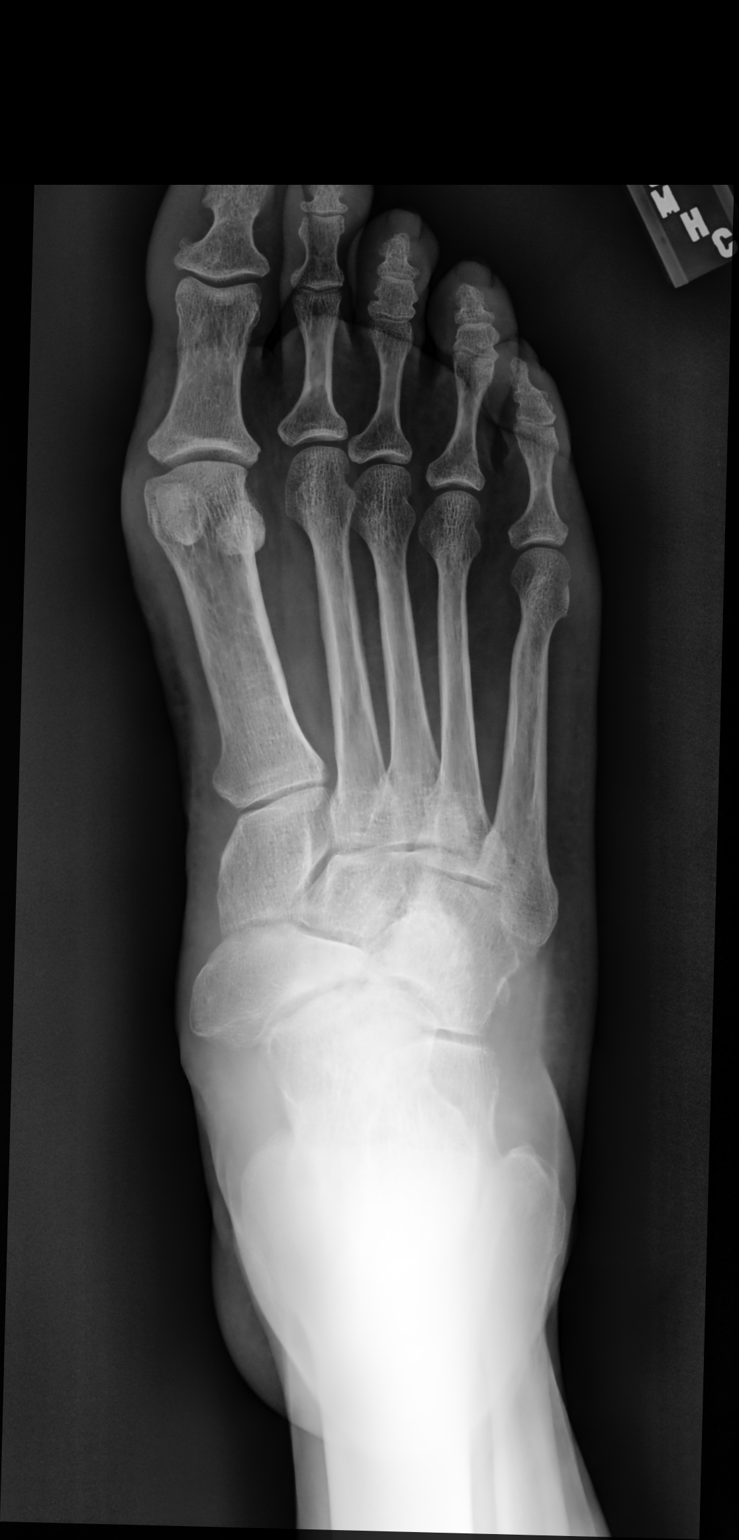

[foot mlo]
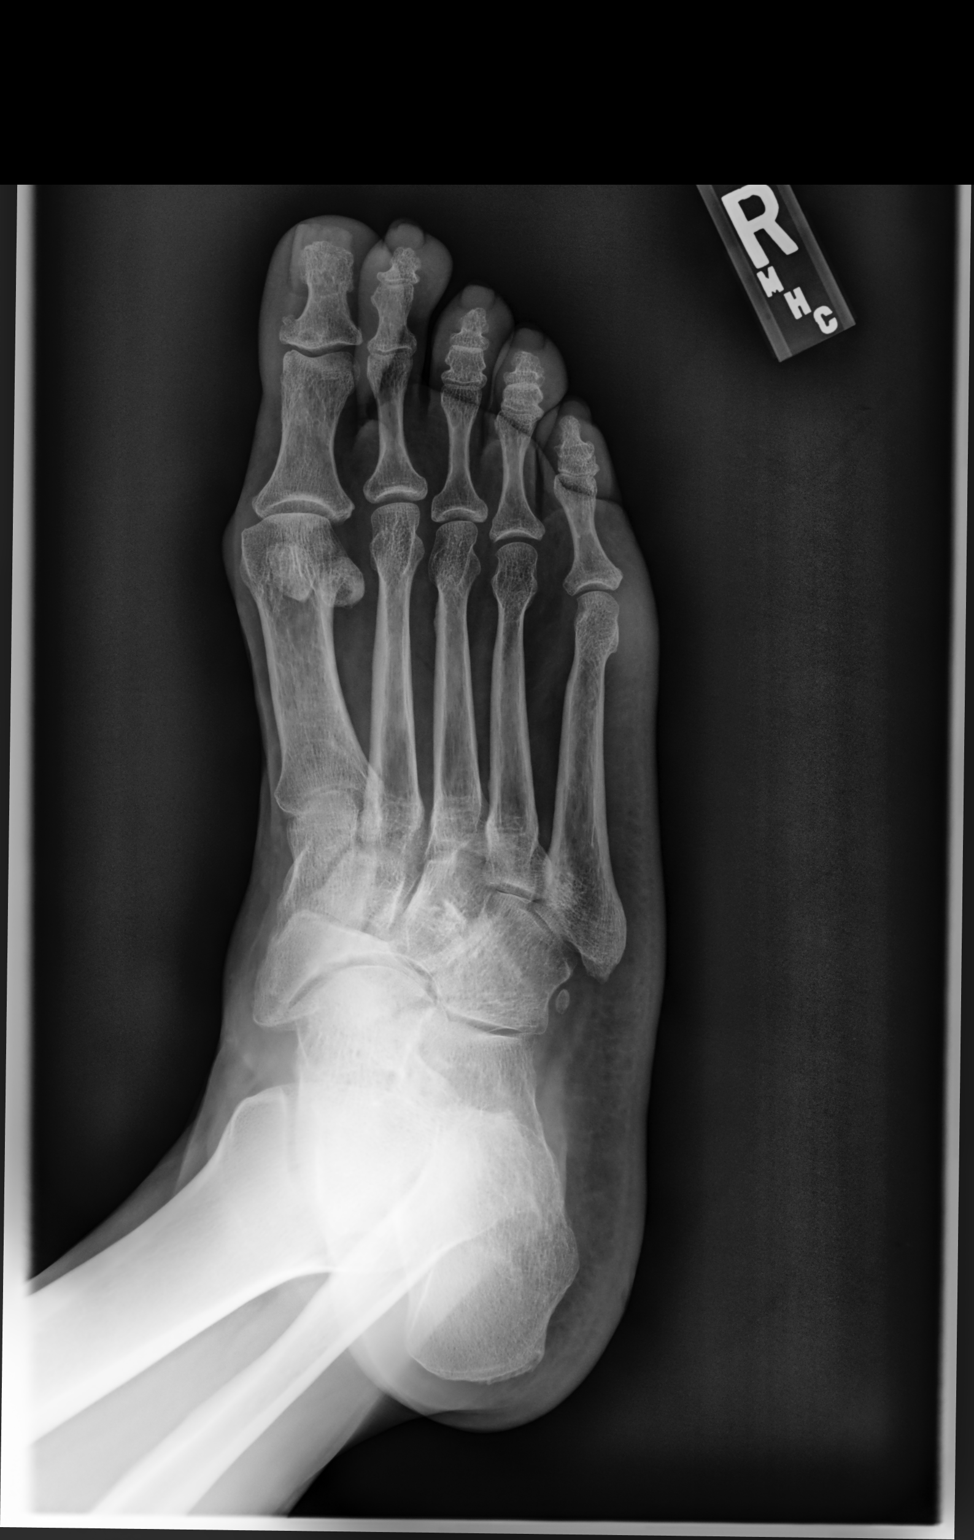

[foot lat]
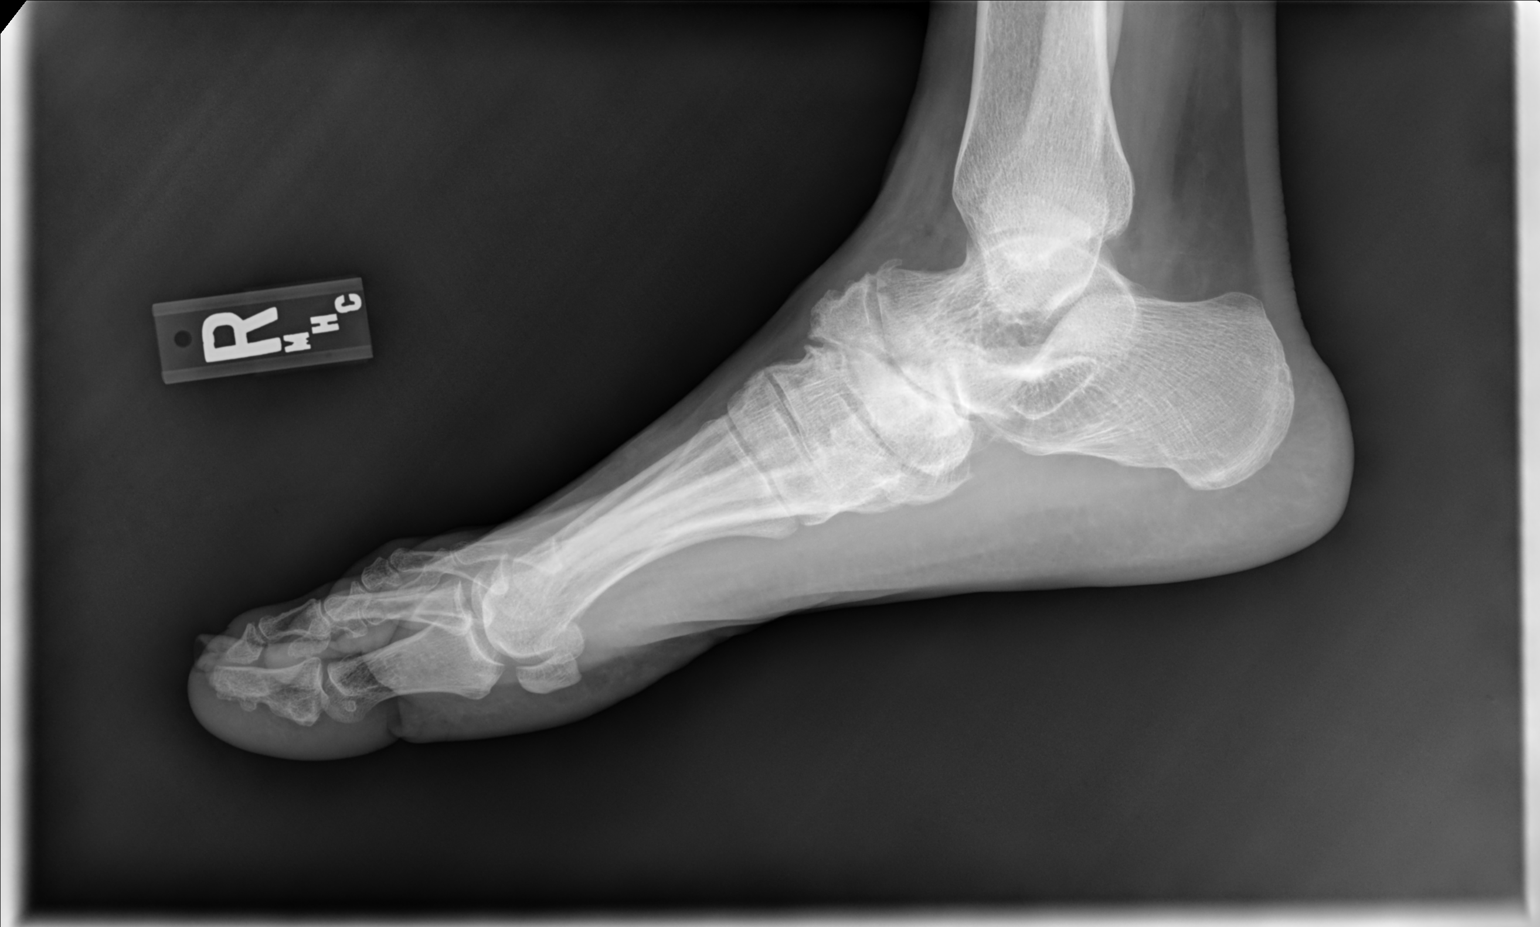

[3 of 3 positions shown; findings below may reference images not displayed]

FINDINGS: There is no evidence of fracture or dislocation. Severe
osteoarthritis is seen involving the talonavicular and
navicular-cuneiform joints. No other focal bone lesions identified.
IMPRESSION: No acute findings.

Severe talonavicular and navicular-cuneiform osteoarthritis.

## 2024-05-03 ENCOUNTER — Ambulatory Visit
Admission: RE | Admit: 2024-05-03 | Discharge: 2024-05-03 | Disposition: A | Discharge status: Home / Self Care | Payer: Self-pay | Source: Ambulatory Visit | Attending: Emergency Medicine | Admitting: Emergency Medicine

## 2024-05-03 VITALS — BP 108/72 | HR 88 | Temp 98.1°F | Resp 18

## 2024-05-03 DIAGNOSIS — M545 Low back pain, unspecified: Secondary | ICD-10-CM

## 2024-05-03 MED ORDER — KETOROLAC TROMETHAMINE 30 MG/ML IJ SOLN
30.0000 mg | Freq: Once | INTRAMUSCULAR | Status: AC
  Administered 2024-05-03: 30 mg via INTRAMUSCULAR

## 2024-05-03 MED ORDER — CYCLOBENZAPRINE HCL 10 MG PO TABS: 10.0000 mg | ORAL_TABLET | Freq: Two times a day (BID) | ORAL | 0 refills | Status: AC | PRN

## 2024-05-03 MED ORDER — PREDNISONE 10 MG (21) PO TBPK: ORAL_TABLET | Freq: Every day | ORAL | 0 refills | Status: AC

## 2024-05-03 NOTE — Discharge Instructions (Addendum)
 Your pain is most likely caused by irritation to the muscles.  You have been given an injection of Toradol  which is inflammation to help with pain and start to see relief within the hour  Start tomorrow 2 prednisone  every morning with food continue naproxen as above avoid use of ibuprofen  while taking Mobic  take Tylenol  and topical medicines additionally  May use muscle relaxant twice daily as needed for additional comfort, be mindful this can make you drowsy  You may use heating pad in 15 minute intervals as needed for additional comfort   Begin massaging and stretching affected area daily for 10 minutes as tolerated to further loosen muscles   When sitting and lying down place pillow underneath and between knees for support  Can try sleeping without pillow on firm mattress   Practice good posture: head back, shoulders back, chest forward, pelvis back and weight distributed evenly on both legs  If pain persist after recommended treatment or reoccurs if may be beneficial to follow up with orthopedic specialist for evaluation, this doctor specializes in the bones and can manage your symptoms long-term with options such as but not limited to imaging, medications or physical therapy

## 2024-05-03 NOTE — ED Provider Notes (Signed)
 Denise Olsen    CSN: [id number] Arrival date & time: 05/03/24  1112      History   Chief Complaint Chief Complaint  Patient presents with   Back Pain    Entered by patient    HPI Denise Olsen is a 53 y.o. female.   Patient presents for evaluation of bilateral low back pain beginning 7 days ago after lifting a heavy object at work.  Pain radiates to the bilateral hips and into the groin.  Has been constant described as an aching.  Symptoms exacerbated by walking, standing, lifting of the legs and bending.  Denies presence of numbness or tingling.  Has attempted ibuprofen  once.  History of a lumbar back surgery.  Past Medical History:  Diagnosis Date   Diabetes mellitus without complication 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      Home Medications    Prior to Admission medications   Medication Sig Start Date End Date Taking? Authorizing Provider  cyclobenzaprine  (FLEXERIL ) 10 MG tablet Take 1 tablet (10 mg total) by mouth 2 (two) times daily as needed for muscle spasms. 05/03/24  Yes Reuben Knoblock R, NP  predniSONE  (STERAPRED UNI-PAK 21 TAB) 10 MG (21) TBPK tablet Take by mouth daily. Take 6 tabs by mouth daily  for 1 days, then 5 tabs for 1 days, then 4 tabs for 1 days, then 3 tabs for 1 days, 2 tabs for 1  days, then 1 tab by mouth daily for 1 days 05/03/24  Yes Khushboo Chuck R, NP  albuterol  (VENTOLIN  HFA) 108 (90 Base) MCG/ACT inhaler Inhale 1-2 puffs into the lungs every 6 (six) hours as needed for wheezing or shortness of breath. 08/22/21   Arloa Suzen RAMAN, NP  atorvastatin  (LIPITOR) 40 MG tablet Take 1 tablet (40 mg total) by mouth daily. 04/29/23 05/29/23  Arvis Jolan NOVAK, PA-C  Blood Glucose Monitoring Suppl (GLUCOCOM BLOOD GLUCOSE MONITOR) DEVI Accu-Chek Guide Me Glucose Meter  USE AS DIRECTED    [provider]  clotrimazole  (LOTRIMIN ) 1 % cream Apply to affected area 2 times daily x 10-14 days 04/29/23   Arvis Jolan B, PA-C  escitalopram (LEXAPRO) 10 MG tablet Take by mouth. Patient not taking: Reported on 04/29/2023 10/22/21 10/22/22  [provider]  gabapentin  (NEURONTIN ) 300 MG capsule Take 1 capsule (300 mg total) by mouth daily. 04/29/23 05/29/23  Arvis Jolan NOVAK, PA-C  glipiZIDE  (GLUCOTROL  XL) 10 MG 24 hr tablet Take 1 tablet (10 mg total) by mouth daily. 04/29/23 05/29/23  Arvis Jolan B, PA-C  ondansetron  (ZOFRAN ) 4 MG tablet Take 1 tablet (4 mg total) by mouth every 8 (eight) hours as needed for vomiting or nausea. 10/21/23 10/20/24  Malvina Alm DASEN, MD    Family History History reviewed. No pertinent  family history.  Social History Social History   Tobacco Use   Smoking status: Every Day   Smokeless tobacco: Never  Vaping Use   Vaping status: Former  Substance Use Topics   Alcohol use: Yes    Comment: Ocassionally   Drug use: Not Currently    Types: Marijuana     Allergies   Oxycodone -acetaminophen    Review of Systems Review of Systems   Physical Exam Triage Vital Signs ED Triage Vitals  Encounter Vitals Group     BP 05/03/24 1118 108/72     Girls Systolic BP Percentile --      Girls Diastolic BP Percentile --      Boys Systolic BP Percentile --      Boys Diastolic BP Percentile --      Pulse Rate 05/03/24 1118 88     Resp 05/03/24 1118  18     Temp 05/03/24 1118 98.1 F (36.7 C)     Temp Source 05/03/24 1118 Oral     SpO2 05/03/24 1118 100 %     Weight --      Height --      Head Circumference --      Peak Flow --      Pain Score 05/03/24 1121 6     Pain Loc --      Pain Education --      Exclude from Growth Chart --    No data found.  Updated Vital Signs BP 108/72 (BP Location: Left Arm)   Pulse 88   Temp 98.1 F (36.7 C) (Oral)   Resp 18   LMP  (LMP Unknown)   SpO2 100%   Visual Acuity Right Eye Distance:   Left Eye Distance:   Bilateral Distance:    Right Eye Near:   Left Eye Near:    Bilateral Near:     Physical Exam Constitutional:      Appearance: Normal appearance.  Eyes:     Extraocular Movements: Extraocular movements intact.  Pulmonary:     Effort: Pulmonary effort is normal.  Musculoskeletal:     Comments: Tenderness within the midline of the lumbar region without spinal tenderness, no ecchymosis swelling or deformity, positive straight leg test bilaterally, pain elicited with lateral turns and bending  Neurological:     Mental Status: She is alert and oriented to person, place, and time. Mental status is at baseline.      UC Treatments / Results  Labs (all labs ordered are listed, but only abnormal results are displayed) Labs Reviewed - No data to display  EKG   Radiology No results found.  Procedures Procedures (including critical care time)  Medications Ordered in UC Medications  ketorolac  (TORADOL ) 30 MG/ML injection 30 mg (has no administration in time range)    Initial Impression / Assessment and Plan / UC Course  I have reviewed the triage vital signs and the nursing notes.  Pertinent labs & imaging results that were available during my care of the patient were reviewed by me and considered in my medical decision making (see chart for details).  Acute midline low back pain without sciatica  Etiology most likely muscular, no direct injury therefore deferring  imaging at this time, Toradol  IM given and prescribed prednisone  and Flexeril  for home use, recommended supportive care through [facility], heat massage stretching with activity as tolerated, walking referral given to orthopedics if symptoms continue to persist or worsen Final Clinical Impressions(s) / UC Diagnoses   Final diagnoses:  Acute midline low back pain without sciatica     Discharge Instructions      Your pain is most likely caused by irritation to the muscles.  You have been given an injection of Toradol  which is inflammation to help with pain and start to see relief within the hour  Start tomorrow 2 prednisone  every morning with food continue naproxen as above avoid use of ibuprofen  while taking Mobic  take Tylenol  and topical medicines additionally  May use muscle relaxant twice daily as needed for additional comfort, be mindful this can make you drowsy  You may use heating pad in 15 minute intervals as needed for additional comfort   Begin massaging and stretching affected area daily for 10 minutes as tolerated to further loosen muscles   When sitting and lying down place pillow underneath and between knees for support  Can try sleeping without pillow on firm mattress   Practice good posture: head back, shoulders back, chest forward, pelvis back and weight distributed evenly on both legs  If pain persist after recommended treatment or reoccurs if may be beneficial to follow up with orthopedic specialist for evaluation, this doctor specializes in the bones and can manage your symptoms long-term with options such as but not limited to imaging, medications or physical therapy      ED Prescriptions     Medication Sig Dispense Auth. Provider   predniSONE  (STERAPRED UNI-PAK 21 TAB) 10 MG (21) TBPK tablet Take by mouth daily. Take 6 tabs by mouth daily  for 1 days, then 5 tabs for 1 days, then 4 tabs for 1 days, then 3 tabs for 1 days, 2 tabs for 1 days, then 1 tab by mouth daily  for 1 days 21 tablet Crystal Scarberry R, NP   cyclobenzaprine  (FLEXERIL ) 10 MG tablet Take 1 tablet (10 mg total) by mouth 2 (two) times daily as needed for muscle spasms. 20 tablet Teriah Muela R, NP      PDMP not reviewed this encounter.   Teresa Shelba SAUNDERS, NP 05/03/24 1144

## 2024-05-03 NOTE — ED Triage Notes (Signed)
 Patient stated a couple weeks ago she was lifting something heavy at work. Patient now complains of lower back pain radiating to hips . Rates pain 6/10. Patient has not taken anything for symptoms.

## 2024-06-14 ENCOUNTER — Ambulatory Visit
Admission: RE | Admit: 2024-06-14 | Discharge: 2024-06-14 | Disposition: A | Discharge status: Home / Self Care | Attending: Emergency Medicine | Admitting: Emergency Medicine

## 2024-06-14 VITALS — BP 113/72 | HR 87 | Temp 98.1°F | Resp 14 | Ht 64.0 in | Wt 149.9 lb

## 2024-06-14 DIAGNOSIS — M545 Low back pain, unspecified: Secondary | ICD-10-CM

## 2024-06-14 DIAGNOSIS — G8929 Other chronic pain: Secondary | ICD-10-CM

## 2024-06-14 MED ORDER — BACLOFEN 10 MG PO TABS: 10.0000 mg | ORAL_TABLET | Freq: Three times a day (TID) | ORAL | 0 refills | Status: AC

## 2024-06-14 MED ORDER — IBUPROFEN 600 MG PO TABS: 600.0000 mg | ORAL_TABLET | Freq: Four times a day (QID) | ORAL | 0 refills | Status: AC | PRN

## 2024-06-14 MED ORDER — KETOROLAC TROMETHAMINE 30 MG/ML IJ SOLN
30.0000 mg | Freq: Once | INTRAMUSCULAR | Status: AC
  Administered 2024-06-14: 30 mg via INTRAMUSCULAR

## 2024-06-14 NOTE — ED Provider Notes (Signed)
 [facility]    CSN: [id number] Arrival date & time: 06/14/24  9062      History   Chief Complaint Chief Complaint  Patient presents with   Back Pain    Appointment    HPI Denise Olsen is a 53 y.o. female.   HPI  53 year old female with past medical history significant for diabetes, dyslipidemia, previous DVT, depression presents for evaluation of ongoing low back pain.  She reports that at her job she is required to do a lot of lifting, twisting, and bending which aggravates her low back.  She has had previous low back surgery in the mid 2012.  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      Home Medications    Prior to Admission medications   Medication Sig Start Date End Date Taking? Authorizing Provider  baclofen (LIORESAL) 10 MG tablet Take 1 tablet (10 mg total) by mouth 3 (three) times daily. 06/14/24  Yes Bernardino Ditch, NP  ibuprofen  (ADVIL ) 600 MG tablet Take 1 tablet (600 mg total) by mouth every 6 (six) hours as needed. 06/14/24  Yes Bernardino Ditch, NP  albuterol  (VENTOLIN  HFA) 108 (90 Base) MCG/ACT inhaler Inhale 1-2 puffs into the lungs every 6 (six) hours as needed for wheezing or shortness of breath. 08/22/21   Arloa Suzen RAMAN, NP  atorvastatin  (LIPITOR) 40 MG tablet Take 1 tablet (40 mg total) by mouth daily. 04/29/23 05/29/23  Arvis Jolan NOVAK, PA-C  Blood Glucose Monitoring Suppl (GLUCOCOM BLOOD GLUCOSE MONITOR) DEVI Accu-Chek Guide Me Glucose Meter  USE AS DIRECTED    [provider]  clotrimazole  (LOTRIMIN ) 1 % cream Apply to affected area 2 times daily x 10-14 days 04/29/23   Arvis Jolan NOVAK, PA-C  cyclobenzaprine  (FLEXERIL ) 10 MG tablet Take 1 tablet (10 mg total) by mouth 2 (two) times daily as needed for muscle spasms. 05/03/24   White, Shelba SAUNDERS, NP  escitalopram (LEXAPRO) 10 MG tablet Take by mouth. Patient not taking: Reported on 04/29/2023 10/22/21 10/22/22  [provider]  gabapentin  (NEURONTIN ) 300 MG capsule Take 1 capsule (300 mg total) by mouth daily. 04/29/23 05/29/23  Arvis Jolan NOVAK, PA-C  glipiZIDE  (GLUCOTROL  XL) 10 MG 24 hr tablet Take 1 tablet (10 mg total) by mouth daily. 04/29/23 05/29/23  Arvis Jolan B, PA-C  ondansetron  (ZOFRAN ) 4 MG tablet Take 1 tablet (4 mg total) by mouth every 8 (eight) hours as needed for vomiting or nausea. 10/21/23 10/20/24  Malvina Alm DASEN, MD  predniSONE  (STERAPRED UNI-PAK 21 TAB) 10 MG (21) TBPK tablet Take by mouth daily. Take 6 tabs by mouth daily  for 1 days, then 5 tabs for 1 days, then 4 tabs for 1  days, then 3 tabs for 1 days, 2 tabs for 1 days, then 1 tab by mouth daily for 1 days 05/03/24   Teresa Shelba SAUNDERS, NP    Family History History reviewed. No pertinent family history.  Social History Social History   Tobacco Use   Smoking status: Every Day   Smokeless tobacco: Never  Vaping Use   Vaping status: Former  Substance Use Topics   Alcohol use: Yes    Comment: Ocassionally   Drug use: Not Currently    Types: Marijuana     Allergies   Oxycodone -acetaminophen    Review of Systems Review of Systems  Musculoskeletal:  Positive for back pain.  Neurological:  Negative for weakness and numbness.     Physical Exam Triage  Vital Signs ED Triage Vitals  Encounter Vitals Group     BP      Girls Systolic BP Percentile      Girls Diastolic BP Percentile      Boys Systolic BP Percentile      Boys Diastolic BP Percentile      Pulse      Resp      Temp      Temp src      SpO2      Weight      Height      Head Circumference      Peak Flow      Pain Score      Pain Loc      Pain Education      Exclude from Growth Chart    No data found.  Updated Vital Signs BP 113/72 (BP Location: Left Arm)   Pulse 87   Temp 98.1 F (36.7 C) (Oral)   Resp 14   Ht 5' 4 (1.626 m)   Wt 149 lb 14.6 oz (68 kg)   SpO2 96%   BMI 25.73 kg/m   Visual Acuity Right Eye Distance:   Left Eye Distance:   Bilateral Distance:    Right Eye Near:   Left Eye Near:    Bilateral Near:     Physical Exam Vitals and nursing note reviewed.  Constitutional:      Appearance: Normal appearance. She is not ill-appearing.  HENT:     Head: Normocephalic and atraumatic.  Musculoskeletal:        General: Tenderness present. No signs of injury.  Skin:    General: Skin is warm and dry.     Capillary Refill: Capillary refill takes less than 2 seconds.     Findings: No bruising or erythema.  Neurological:     General: No focal deficit present.     Mental Status: She is alert and oriented to person, place, and time.      UC Treatments / Results  Labs (all labs ordered are listed, but only abnormal results are displayed) Labs Reviewed - No data to display  EKG   Radiology No results found.  Procedures Procedures (including critical care time)  Medications Ordered in UC Medications - No data to display  Initial Impression / Assessment and Plan / UC Course  I have reviewed the triage vital signs and the nursing notes.  Pertinent labs & imaging results that were available during my care of the patient were reviewed by me and considered in my medical decision making (see chart for details).   Patient is a  nontoxic-appearing 53 year old female presenting for evaluation of acute on chronic low back pain.  She was seen in 2012  by Dr. Zena and had surgery on her low back.  She is unsure of what the surgery is though she does have a midline lumbar incision.  Records not available in epic.  She was given a previous referral to the spine center but I do not see any notes indicating that she followed up with the spine center.  Patient did have an MRI of her lumbar spine performed in January 2022 which did show sequela of laminotomy and partial discectomy at L4-L5 level.  At that time she had bilateral L5-S1 neuroforaminal narrowing.  In August of this year she was seen in urgent care in [facility] for acute on chronic low back pain.  Imaging was deferred at that time and she was treated conservatively with IM Toradol , prednisone  taper, and Flexeril .  I will order 30 mg of IM Toradol  to be administered here in clinic and then discharge the patient home on ibuprofen  and baclofen as well as refer the patient to the spine center for further evaluation and treatment.  I will also give her a note to return to work on Monday with a weight restriction of lifting no more than 20 pounds and limiting stooping, bending, and twisting until evaluated and cleared by spine center.  Review of labs in epic from February of this year showed normal renal function with a BUN of 14 and a creatinine 0.63.   Final Clinical Impressions(s) / UC Diagnoses   Final diagnoses:  Acute on chronic low back pain     Discharge Instructions      Take the ibuprofen , 600 mg every 6 hours with food, on a schedule for the next 48 hours and then as needed.  Take the baclofen, 10 mg every 8 hours, on a schedule for the next 48 hours and then as needed.  Apply moist heat to your back for 30 minutes at a time 2-3 times a day to improve blood flow to the area and help remove the lactic acid causing the spasm.  Follow the back exercises given at  discharge.  Return for reevaluation for any new or worsening symptoms.      ED Prescriptions     Medication Sig Dispense Auth. Provider   ibuprofen  (ADVIL ) 600 MG tablet Take 1 tablet (600 mg total) by mouth every 6 (six) hours as needed. 30 tablet Bernardino Ditch, NP   baclofen (LIORESAL) 10 MG tablet Take 1 tablet (10 mg total) by mouth 3 (three) times daily. 30 each Bernardino Ditch, NP      PDMP not reviewed this encounter.   Bernardino Ditch, NP 06/14/24 1026

## 2024-06-14 NOTE — ED Triage Notes (Signed)
 Patient reports ongoing lower back pain that radiates to her hips.  Patient was seen in August for lower back pain at Urgent care in [place].  Patient took a muscle relaxer yesterday.

## 2024-06-14 NOTE — Discharge Instructions (Signed)
